# Patient Record
Sex: Female | Born: 1935 | Race: White | Hispanic: No | Smoking: Never smoker
Health system: Southern US, Community
[De-identification: ages and names within clinical notes are randomized; demographics above are authoritative.]

## PROBLEM LIST (undated history)

## (undated) DIAGNOSIS — E1140 Type 2 diabetes mellitus with diabetic neuropathy, unspecified: Secondary | ICD-10-CM

## (undated) DIAGNOSIS — I4891 Unspecified atrial fibrillation: Secondary | ICD-10-CM

## (undated) DIAGNOSIS — K219 Gastro-esophageal reflux disease without esophagitis: Secondary | ICD-10-CM

## (undated) DIAGNOSIS — I428 Other cardiomyopathies: Secondary | ICD-10-CM

## (undated) HISTORY — DX: Type 2 diabetes mellitus with diabetic neuropathy, unspecified: E11.40

## (undated) HISTORY — PX: TONSILLECTOMY: SUR1361

## (undated) HISTORY — DX: Unspecified atrial fibrillation: I48.91

## (undated) HISTORY — DX: Gastro-esophageal reflux disease without esophagitis: K21.9

## (undated) HISTORY — DX: Other cardiomyopathies: I42.8

## (undated) HISTORY — PX: CATARACT EXTRACTION: SUR2

## (undated) HISTORY — PX: ABDOMINAL HYSTERECTOMY: SHX81

---

## 2003-11-15 ENCOUNTER — Ambulatory Visit: Payer: Self-pay | Admitting: Urology

## 2004-01-01 ENCOUNTER — Ambulatory Visit: Payer: Self-pay | Admitting: Internal Medicine

## 2004-06-11 ENCOUNTER — Ambulatory Visit: Payer: Self-pay | Admitting: Nurse Practitioner

## 2004-07-02 ENCOUNTER — Ambulatory Visit: Payer: Self-pay | Admitting: Cardiology

## 2004-07-15 ENCOUNTER — Ambulatory Visit: Payer: Self-pay | Admitting: Urology

## 2004-07-17 ENCOUNTER — Ambulatory Visit: Payer: Self-pay | Admitting: Internal Medicine

## 2004-08-10 ENCOUNTER — Ambulatory Visit: Payer: Self-pay | Admitting: Internal Medicine

## 2004-09-10 ENCOUNTER — Ambulatory Visit: Payer: Self-pay

## 2004-09-10 ENCOUNTER — Ambulatory Visit: Payer: Self-pay | Admitting: Internal Medicine

## 2004-10-22 ENCOUNTER — Ambulatory Visit: Payer: Self-pay | Admitting: Internal Medicine

## 2005-06-23 ENCOUNTER — Ambulatory Visit: Payer: Self-pay | Admitting: Internal Medicine

## 2006-01-05 ENCOUNTER — Ambulatory Visit: Payer: Self-pay | Admitting: Internal Medicine

## 2007-01-25 ENCOUNTER — Ambulatory Visit: Payer: Self-pay | Admitting: Internal Medicine

## 2007-03-23 ENCOUNTER — Ambulatory Visit: Payer: Self-pay | Admitting: Gastroenterology

## 2007-03-23 ENCOUNTER — Other Ambulatory Visit: Payer: Self-pay

## 2007-08-05 ENCOUNTER — Ambulatory Visit: Payer: Self-pay | Admitting: Gastroenterology

## 2007-08-05 ENCOUNTER — Other Ambulatory Visit: Payer: Self-pay

## 2007-08-09 ENCOUNTER — Ambulatory Visit: Payer: Self-pay | Admitting: Gastroenterology

## 2007-09-11 ENCOUNTER — Ambulatory Visit: Payer: Self-pay | Admitting: Oncology

## 2007-09-22 ENCOUNTER — Ambulatory Visit: Payer: Self-pay | Admitting: Oncology

## 2007-10-12 ENCOUNTER — Ambulatory Visit: Payer: Self-pay | Admitting: Oncology

## 2007-11-11 ENCOUNTER — Ambulatory Visit: Payer: Self-pay | Admitting: Oncology

## 2008-02-01 ENCOUNTER — Ambulatory Visit: Payer: Self-pay | Admitting: Internal Medicine

## 2008-02-11 ENCOUNTER — Ambulatory Visit: Payer: Self-pay | Admitting: Oncology

## 2008-02-29 ENCOUNTER — Ambulatory Visit: Payer: Self-pay | Admitting: Oncology

## 2008-03-13 ENCOUNTER — Ambulatory Visit: Payer: Self-pay | Admitting: Oncology

## 2008-08-10 ENCOUNTER — Ambulatory Visit: Payer: Self-pay | Admitting: Oncology

## 2008-08-29 ENCOUNTER — Ambulatory Visit: Payer: Self-pay | Admitting: Oncology

## 2008-09-10 ENCOUNTER — Ambulatory Visit: Payer: Self-pay | Admitting: Oncology

## 2009-02-07 ENCOUNTER — Ambulatory Visit: Payer: Self-pay | Admitting: Internal Medicine

## 2009-03-13 ENCOUNTER — Ambulatory Visit: Payer: Self-pay | Admitting: Oncology

## 2009-03-20 ENCOUNTER — Ambulatory Visit: Payer: Self-pay | Admitting: Oncology

## 2009-04-10 ENCOUNTER — Ambulatory Visit: Payer: Self-pay | Admitting: Oncology

## 2009-08-03 ENCOUNTER — Emergency Department: Payer: Self-pay | Admitting: Internal Medicine

## 2009-10-25 ENCOUNTER — Ambulatory Visit: Payer: Self-pay | Admitting: Internal Medicine

## 2009-10-29 ENCOUNTER — Inpatient Hospital Stay: Payer: Self-pay | Admitting: Internal Medicine

## 2010-02-19 ENCOUNTER — Ambulatory Visit: Payer: Self-pay | Admitting: Internal Medicine

## 2010-03-05 ENCOUNTER — Inpatient Hospital Stay: Payer: Self-pay | Admitting: Internal Medicine

## 2010-09-18 ENCOUNTER — Ambulatory Visit: Payer: Self-pay | Admitting: Ophthalmology

## 2010-10-02 ENCOUNTER — Inpatient Hospital Stay: Payer: Self-pay | Admitting: Internal Medicine

## 2010-10-09 DIAGNOSIS — I509 Heart failure, unspecified: Secondary | ICD-10-CM

## 2010-10-09 DIAGNOSIS — I4891 Unspecified atrial fibrillation: Secondary | ICD-10-CM

## 2010-10-09 DIAGNOSIS — J9610 Chronic respiratory failure, unspecified whether with hypoxia or hypercapnia: Secondary | ICD-10-CM

## 2010-10-09 DIAGNOSIS — R7881 Bacteremia: Secondary | ICD-10-CM

## 2010-10-09 DIAGNOSIS — A419 Sepsis, unspecified organism: Secondary | ICD-10-CM

## 2010-10-09 DIAGNOSIS — E1165 Type 2 diabetes mellitus with hyperglycemia: Secondary | ICD-10-CM

## 2010-10-09 DIAGNOSIS — D649 Anemia, unspecified: Secondary | ICD-10-CM

## 2010-10-15 ENCOUNTER — Telehealth: Payer: Self-pay | Admitting: *Deleted

## 2010-10-15 NOTE — Telephone Encounter (Signed)
Will review at [facility] today

## 2010-10-15 NOTE — Telephone Encounter (Signed)
[facility] Triage Call Report Triage Record Num: [id number] Operator: Lodema Pilot Patient Name: Alicia Henry Call Date & Time: 10/12/2010 10:34:55AM Patient Phone: [phone] PCP: Patient Gender: Female PCP Fax : Patient DOB: 1924/09/05 Practice Name: [place] [facility] Reason for Call: PCP is Ruthe Mannan [facility]). Callback number is [phone]. Becky,Nurse, from [facility] calling regarding Alicia Henry. Pt has itchy rash around "panty line" from wearing Depends Undergarment. Per Rash Protocol, standing order of Benadryl 25 mg PO q 4 hours PRN itchy. Max 6 doses in 24 hours. Call PCP if not better in 48 hours. Protocol(s) Used: Hives Protocol(s) Used: Rash Recommended Outcome per Protocol: See Provider within 4 hours Reason for Outcome: First occurrence of mild allergic reaction (rash; hives; itching; nasal congestion; watery, red eyes) that occurs within minutes to several hours after exposure to an allergen AND without any other signs or symptoms of anaphylaxis. Localized or widespread rash Care Advice: ~ Call provider if symptoms worsen or new symptoms develop. ~ SYMPTOM / CONDITION MANAGEMENT 10/12/2010 10:58:35AM Page 1 of 1 [email or web address]_[email or web address]_[email or web address]

## 2010-10-31 DIAGNOSIS — L03119 Cellulitis of unspecified part of limb: Secondary | ICD-10-CM

## 2010-10-31 DIAGNOSIS — L02419 Cutaneous abscess of limb, unspecified: Secondary | ICD-10-CM

## 2010-11-06 DIAGNOSIS — L0291 Cutaneous abscess, unspecified: Secondary | ICD-10-CM

## 2010-11-06 DIAGNOSIS — L0390 Cellulitis, unspecified: Secondary | ICD-10-CM

## 2010-11-27 DIAGNOSIS — S91009A Unspecified open wound, unspecified ankle, initial encounter: Secondary | ICD-10-CM

## 2010-11-27 DIAGNOSIS — I509 Heart failure, unspecified: Secondary | ICD-10-CM

## 2010-11-27 DIAGNOSIS — R269 Unspecified abnormalities of gait and mobility: Secondary | ICD-10-CM

## 2010-11-27 DIAGNOSIS — E119 Type 2 diabetes mellitus without complications: Secondary | ICD-10-CM

## 2010-11-27 DIAGNOSIS — S81809A Unspecified open wound, unspecified lower leg, initial encounter: Secondary | ICD-10-CM

## 2010-11-27 DIAGNOSIS — S81009A Unspecified open wound, unspecified knee, initial encounter: Secondary | ICD-10-CM

## 2010-12-04 ENCOUNTER — Ambulatory Visit: Payer: Self-pay | Admitting: Ophthalmology

## 2011-05-06 ENCOUNTER — Other Ambulatory Visit: Payer: Medicare Other

## 2011-06-04 ENCOUNTER — Other Ambulatory Visit: Payer: Medicare Other

## 2011-06-09 ENCOUNTER — Inpatient Hospital Stay: Payer: Self-pay | Admitting: Internal Medicine

## 2011-06-09 LAB — PRO B NATRIURETIC PEPTIDE: B-Type Natriuretic Peptide: 2978 pg/mL — ABNORMAL HIGH (ref 0–450)

## 2011-06-09 LAB — COMPREHENSIVE METABOLIC PANEL
BUN: 47 mg/dL — ABNORMAL HIGH (ref 7–18)
Co2: 30 mmol/L (ref 21–32)
Creatinine: 1.54 mg/dL — ABNORMAL HIGH (ref 0.60–1.30)
Osmolality: 296 (ref 275–301)
Potassium: 4.7 mmol/L (ref 3.5–5.1)
SGPT (ALT): 14 U/L
Total Protein: 7.6 g/dL (ref 6.4–8.2)

## 2011-06-09 LAB — CBC
HGB: 11.3 g/dL — ABNORMAL LOW (ref 12.0–16.0)
MCHC: 32.7 g/dL (ref 32.0–36.0)
MCV: 97 fL (ref 80–100)
RBC: 3.58 10*6/uL — ABNORMAL LOW (ref 3.80–5.20)
RDW: 16.2 % — ABNORMAL HIGH (ref 11.5–14.5)

## 2011-06-09 LAB — URINALYSIS, COMPLETE
Nitrite: NEGATIVE
RBC,UR: 5 /HPF (ref 0–5)
WBC UR: 6 /HPF (ref 0–5)

## 2011-06-09 LAB — CK TOTAL AND CKMB (NOT AT ARMC): CK-MB: 1.5 ng/mL (ref 0.5–3.6)

## 2011-06-09 LAB — PROTIME-INR: INR: 1.7

## 2011-06-09 LAB — TROPONIN I
Troponin-I: <0.02 ng/mL
Troponin-I: 0.02 ng/mL
Troponin-I: 0.02 ng/mL

## 2011-06-09 LAB — LIPID PANEL
HDL Cholesterol: 26 mg/dL — ABNORMAL LOW (ref 40–60)
Ldl Cholesterol, Calc: 36 mg/dL (ref 0–100)
VLDL Cholesterol, Calc: 9 mg/dL (ref 5–40)

## 2011-06-10 LAB — LIPID PANEL
Cholesterol: 61 mg/dL (ref 0–200)
HDL Cholesterol: 22 mg/dL — ABNORMAL LOW (ref 40–60)
Triglycerides: 37 mg/dL (ref 0–200)
VLDL Cholesterol, Calc: 7 mg/dL (ref 5–40)

## 2011-06-10 LAB — CBC WITH DIFFERENTIAL/PLATELET
Basophil %: 0.8 %
Eosinophil %: 2.4 %
HCT: 32.6 % — ABNORMAL LOW (ref 35.0–47.0)
MCH: 31.4 pg (ref 26.0–34.0)
MCHC: 32.9 g/dL (ref 32.0–36.0)
Monocyte #: 0.6 x10 3/mm (ref 0.2–0.9)
Neutrophil #: 3.9 10*3/uL (ref 1.4–6.5)
RDW: 15.9 % — ABNORMAL HIGH (ref 11.5–14.5)
WBC: 5.9 10*3/uL (ref 3.6–11.0)

## 2011-06-10 LAB — PROTIME-INR
INR: 2.1
Prothrombin Time: 24 secs — ABNORMAL HIGH (ref 11.5–14.7)

## 2011-06-10 LAB — BASIC METABOLIC PANEL
Anion Gap: 8 (ref 7–16)
BUN: 46 mg/dL — ABNORMAL HIGH (ref 7–18)
EGFR (African American): 37 — ABNORMAL LOW
EGFR (Non-African Amer.): 32 — ABNORMAL LOW
Osmolality: 294 (ref 275–301)
Sodium: 140 mmol/L (ref 136–145)

## 2011-06-10 LAB — URINE CULTURE

## 2011-06-10 LAB — MAGNESIUM: Magnesium: 2.6 mg/dL — ABNORMAL HIGH

## 2011-06-11 LAB — URINALYSIS, COMPLETE
Bacteria: NONE SEEN
Blood: NEGATIVE
Ketone: NEGATIVE
Ph: 7 (ref 4.5–8.0)
Protein: 30
Specific Gravity: 1.01 (ref 1.003–1.030)
WBC UR: 1 /HPF (ref 0–5)

## 2011-06-11 LAB — CBC WITH DIFFERENTIAL/PLATELET
Basophil #: 0.1 10*3/uL (ref 0.0–0.1)
Basophil %: 0.8 %
Eosinophil #: 0.3 10*3/uL (ref 0.0–0.7)
HCT: 33 % — ABNORMAL LOW (ref 35.0–47.0)
MCV: 96 fL (ref 80–100)
Monocyte %: 12.4 %
Neutrophil %: 63.3 %
RBC: 3.43 10*6/uL — ABNORMAL LOW (ref 3.80–5.20)
RDW: 16.1 % — ABNORMAL HIGH (ref 11.5–14.5)
WBC: 5.9 10*3/uL (ref 3.6–11.0)

## 2011-06-11 LAB — BASIC METABOLIC PANEL
Anion Gap: 7 (ref 7–16)
BUN: 40 mg/dL — ABNORMAL HIGH (ref 7–18)
Calcium, Total: 8.3 mg/dL — ABNORMAL LOW (ref 8.5–10.1)
Chloride: 103 mmol/L (ref 98–107)
Co2: 30 mmol/L (ref 21–32)
Creatinine: 1.35 mg/dL — ABNORMAL HIGH (ref 0.60–1.30)
EGFR (African American): 42 — ABNORMAL LOW
Glucose: 92 mg/dL (ref 65–99)
Sodium: 140 mmol/L (ref 136–145)

## 2011-06-11 LAB — PROTIME-INR: Prothrombin Time: 26.7 secs — ABNORMAL HIGH (ref 11.5–14.7)

## 2011-06-12 LAB — HEPATIC FUNCTION PANEL A (ARMC)
Bilirubin, Direct: 0.2 mg/dL (ref 0.00–0.20)
Bilirubin,Total: 0.4 mg/dL (ref 0.2–1.0)
SGOT(AST): 15 U/L (ref 15–37)
SGPT (ALT): 13 U/L

## 2011-06-12 LAB — BASIC METABOLIC PANEL
Anion Gap: 4 — ABNORMAL LOW (ref 7–16)
Anion Gap: 3 — ABNORMAL LOW (ref 7–16)
BUN: 43 mg/dL — ABNORMAL HIGH (ref 7–18)
BUN: 40 mg/dL — ABNORMAL HIGH (ref 7–18)
Calcium, Total: 8.3 mg/dL — ABNORMAL LOW (ref 8.5–10.1)
Chloride: 103 mmol/L (ref 98–107)
Co2: 31 mmol/L (ref 21–32)
Co2: 32 mmol/L (ref 21–32)
Creatinine: 1.25 mg/dL (ref 0.60–1.30)
EGFR (African American): 42 — ABNORMAL LOW
EGFR (African American): 46 — ABNORMAL LOW
EGFR (Non-African Amer.): 36 — ABNORMAL LOW
EGFR (Non-African Amer.): 39 — ABNORMAL LOW
Glucose: 170 mg/dL — ABNORMAL HIGH (ref 65–99)
Osmolality: 286 (ref 275–301)
Osmolality: 289 (ref 275–301)
Potassium: 5.6 mmol/L — ABNORMAL HIGH (ref 3.5–5.1)
Potassium: 5.4 mmol/L — ABNORMAL HIGH (ref 3.5–5.1)
Sodium: 136 mmol/L (ref 136–145)

## 2011-06-12 LAB — POTASSIUM: Potassium: 6.5 mmol/L (ref 3.5–5.1)

## 2011-06-12 LAB — PROTIME-INR: Prothrombin Time: 27.6 secs — ABNORMAL HIGH (ref 11.5–14.7)

## 2011-06-12 LAB — CK: CK, Total: 50 U/L (ref 21–215)

## 2011-06-12 LAB — PHOSPHORUS: Phosphorus: 3.3 mg/dL (ref 2.5–4.9)

## 2011-06-13 LAB — PROTEIN / CREATININE RATIO, URINE
Creatinine, Urine: 56.6 mg/dL (ref 30.0–125.0)
Protein, Random Urine: 26 mg/dL — ABNORMAL HIGH (ref 0–12)

## 2011-06-13 LAB — URINALYSIS, COMPLETE
Bacteria: NONE SEEN
Bilirubin,UR: NEGATIVE
Blood: NEGATIVE
Glucose,UR: NEGATIVE mg/dL (ref 0–75)
Ketone: NEGATIVE
Leukocyte Esterase: NEGATIVE
Ph: 7 (ref 4.5–8.0)
RBC,UR: 5 /HPF (ref 0–5)
Specific Gravity: 1.013 (ref 1.003–1.030)
Squamous Epithelial: 1
WBC UR: <1 /HPF (ref 0–5)

## 2011-06-13 LAB — BASIC METABOLIC PANEL
Anion Gap: 6 — ABNORMAL LOW (ref 7–16)
Calcium, Total: 8 mg/dL — ABNORMAL LOW (ref 8.5–10.1)
Creatinine: 1.19 mg/dL (ref 0.60–1.30)
EGFR (African American): 49 — ABNORMAL LOW
Glucose: 143 mg/dL — ABNORMAL HIGH (ref 65–99)
Potassium: 4.9 mmol/L (ref 3.5–5.1)
Sodium: 142 mmol/L (ref 136–145)

## 2011-06-13 LAB — URINE CULTURE

## 2011-06-13 LAB — PROTIME-INR: INR: 3.2

## 2011-06-16 ENCOUNTER — Telehealth: Payer: Self-pay | Admitting: Family Medicine

## 2011-06-16 NOTE — Telephone Encounter (Signed)
Reviewed today appt scheduled for tomorrow

## 2011-06-16 NOTE — Telephone Encounter (Signed)
Triage Record Num: [id number] Operator: Tomasita Crumble Patient Name: Alicia Henry Call Date & Time: 06/15/2011 8:08:08PM Patient Phone: [phone] PCP: Ruthe Mannan Patient Gender: Female PCP Fax : [phone] Patient DOB: 1925/01/25 Practice Name: [facility] Reason for Call: Caller: Effie Shy, LPN from [facility]; PCP: Ruthe Mannan [facility]); CB#: [phone]; Call regarding Needs to report labs; BMP done stat on 06/14/11. Ordered by Dr. Abner Greenspan. Results Na 137, K+ 4.3, [place] 102, CO 29, Glucose 153, BUN 29, Creat 1.14, Calcium 8.4. None are critical values. Patient has generalized edema and patient had Lasix ordered. Per caller, Labs were to indicate if she could tolerate increased Lasix dose. Dr. Abner Greenspan on calll she was given results. Order rec'd: Marland Kitchen May give Lasix 40mg  daily as need for edema or shortness of breath and folow up with PCP on 06/16/11. Caller informed. Protocol(s) Used: PCP Calls, No Triage (Adult) Recommended Outcome per Protocol: Call Provider within 24 Hours Reason for Outcome: Lab calling with test results Care Advice: ~ 06/15/2011 8:22:48PM Page 1 of 1 [email or web address]_[email or web address]_[email or web address]

## 2011-06-16 NOTE — Telephone Encounter (Signed)
Will send to Dr. Alphonsus Sias since he will be seeing her.

## 2011-06-17 DIAGNOSIS — E1149 Type 2 diabetes mellitus with other diabetic neurological complication: Secondary | ICD-10-CM

## 2011-06-17 DIAGNOSIS — I43 Cardiomyopathy in diseases classified elsewhere: Secondary | ICD-10-CM

## 2011-06-17 DIAGNOSIS — I4891 Unspecified atrial fibrillation: Secondary | ICD-10-CM

## 2011-06-17 DIAGNOSIS — R55 Syncope and collapse: Secondary | ICD-10-CM

## 2011-06-17 LAB — UR PROT ELECTROPHORESIS, URINE RANDOM

## 2011-06-19 DIAGNOSIS — S5010XA Contusion of unspecified forearm, initial encounter: Secondary | ICD-10-CM

## 2011-07-21 DIAGNOSIS — K219 Gastro-esophageal reflux disease without esophagitis: Secondary | ICD-10-CM

## 2011-07-21 DIAGNOSIS — E1149 Type 2 diabetes mellitus with other diabetic neurological complication: Secondary | ICD-10-CM

## 2011-07-21 DIAGNOSIS — I4891 Unspecified atrial fibrillation: Secondary | ICD-10-CM

## 2011-07-21 DIAGNOSIS — I43 Cardiomyopathy in diseases classified elsewhere: Secondary | ICD-10-CM

## 2011-07-28 ENCOUNTER — Telehealth: Payer: Self-pay | Admitting: *Deleted

## 2011-07-28 NOTE — Telephone Encounter (Signed)
Denise with Hospice notified as instructed by telephone.

## 2011-07-28 NOTE — Telephone Encounter (Signed)
Ok to have home hospice care.

## 2011-07-28 NOTE — Telephone Encounter (Signed)
Patient is being discharged from [facility] tomorrow. Needs verbal consent for at home hospice care. Please advise.

## 2011-08-11 DIAGNOSIS — I4891 Unspecified atrial fibrillation: Secondary | ICD-10-CM

## 2011-08-11 DIAGNOSIS — I509 Heart failure, unspecified: Secondary | ICD-10-CM

## 2011-08-11 DIAGNOSIS — J449 Chronic obstructive pulmonary disease, unspecified: Secondary | ICD-10-CM

## 2011-08-12 LAB — PROTIME-INR: INR: 3.6 — AB (ref 0.9–1.1)

## 2011-08-13 ENCOUNTER — Telehealth: Payer: Self-pay | Admitting: *Deleted

## 2011-08-13 MED ORDER — LISINOPRIL 2.5 MG PO TABS: 2.5000 mg | ORAL_TABLET | Freq: Every day | ORAL | Status: DC

## 2011-08-13 NOTE — Telephone Encounter (Signed)
Alicia Batten, do you have pharmacy name and how pt takes this medicine?  Is it to go to [facility]?, Does she take one a day?

## 2011-08-13 NOTE — Telephone Encounter (Signed)
Ok to refill but she needs to either establish care here or go see her previous PCP. We were following her at [facility] but she has been discharged home on hospice.

## 2011-08-13 NOTE — Telephone Encounter (Signed)
Lisinopril 2.5 mg 1 PO QD. [facility] [address]. Sorry!

## 2011-08-13 NOTE — Telephone Encounter (Signed)
Received refill request for Lisinopril 2.5 mg. No meds on med list. Looks like a home care/nursing home patient. Please advise.

## 2011-08-18 ENCOUNTER — Telehealth: Payer: Self-pay | Admitting: *Deleted

## 2011-08-18 NOTE — Telephone Encounter (Signed)
Thank you :)

## 2011-08-18 NOTE — Telephone Encounter (Signed)
Left message asking hospice nurse to call me back.

## 2011-08-18 NOTE — Telephone Encounter (Signed)
Please change dose to 2 mg Tuesday, Wednesday, Thursday and Sunday. 3 mg Monday, Friday, Saturday. Recheck PT/INR in 1 week. Unfortunately, she will eventually need to be seen here for me to continue signing her paper work and adjusting her coumadin since she has not established with this practice.

## 2011-08-18 NOTE — Telephone Encounter (Signed)
Hospice nurse is asking that you regulate this pt's coumadin dose. Pt has not been seen in this office as a patient, daughter states she is too ill to come in.  Pt takes 3 mg's on Monday, Wednesday, Friday and Saturday and 2 mg's on Tuesday, Thursday and Sunday.

## 2011-08-18 NOTE — Telephone Encounter (Signed)
Spoke with hospice triage nurse, Jasmine December.  She says Dr. Darrold Junker, one of the doctor's that Hospice consults with, has agreed to monitor pt's coumadin.  I again advised nurse that pt will need to be seen to get established as a patient.  She agreed.

## 2011-08-19 ENCOUNTER — Telehealth: Payer: Self-pay | Admitting: Internal Medicine

## 2011-08-19 NOTE — Telephone Encounter (Signed)
Talia,  I am overwhemed with home visits now. If you can't get out to see her, we will have to say no  Luan Pulling

## 2011-08-19 NOTE — Telephone Encounter (Signed)
I do that frequently Technically, she is not a new patient since she was seen at [facility] Normally I would get a copy of H&P and MAR from [facility] and abstract chart Billing info in chart already  If truly new patient, just need billing and demographics and Lyla Son creates a chart and then you just do the visit  Remember, don't bill new home visit since we have billed visits with her already

## 2011-08-19 NOTE — Telephone Encounter (Signed)
Rich, I have never done a new patient home visit (meaning that they are not established here yet).  That is why I asked her to come to the office first.  If you think it is ok to do a home visit on someone not established with our office, I can see her next week.

## 2011-08-19 NOTE — Telephone Encounter (Signed)
Ok I am very sorry I misunderstood.  Lyla Son, I can see her Wednesday (1 week from tomorrow) afternoon.

## 2011-08-19 NOTE — Telephone Encounter (Signed)
Patient was in Rehab at [facility] and she was released 2-3 weeks ago.  Dr.Aron saw her at [facility].  Her PCP was Dr. Nicanor Bake he is retiring.  Patient has been with Hospice for a year.  Patient can get around the house with her walker,but she's afraid to go out.  Dr. Dayton Martes offered to see her,if she could come in to the office,but she doesn't want to come to the office.  [name] wanted to know if you would do home visits.

## 2011-08-21 ENCOUNTER — Encounter: Payer: Self-pay | Admitting: Family Medicine

## 2011-08-26 ENCOUNTER — Telehealth: Payer: Self-pay | Admitting: Family Medicine

## 2011-08-26 ENCOUNTER — Telehealth: Payer: Self-pay | Admitting: *Deleted

## 2011-08-26 NOTE — Telephone Encounter (Signed)
Message copied by Eliezer Bottom on Tue Aug 26, 2011 11:43 AM ------      Message from: Dianne Dun      Created: Tue Aug 26, 2011  7:25 AM       Please call pt to let her know that I am planning to come see her tomorrow afternoon for a home visit (around 2 or 2:30 pm).      Thanks,      [employer]

## 2011-08-26 NOTE — Telephone Encounter (Signed)
Error. Disregard

## 2011-08-26 NOTE — Telephone Encounter (Deleted)
Alicia Henry sent me an email requested scheduled daily stool softener or laxative.  She has prn dulcolax but forgets to ask for it.  [facility]ul[facility].

## 2011-08-26 NOTE — Telephone Encounter (Signed)
Advised pt of upcoming visit.  I asked her to let her daughter know as well.  Pt's home phone # is [phone], daughter Patricia's is [phone].

## 2011-08-27 ENCOUNTER — Encounter: Payer: Self-pay | Admitting: Family Medicine

## 2011-08-27 ENCOUNTER — Ambulatory Visit: Payer: Medicare Other | Admitting: Family Medicine

## 2011-08-27 VITALS — BP 136/62 | HR 72 | Resp 22

## 2011-08-27 DIAGNOSIS — E1140 Type 2 diabetes mellitus with diabetic neuropathy, unspecified: Secondary | ICD-10-CM

## 2011-08-27 DIAGNOSIS — Z789 Other specified health status: Secondary | ICD-10-CM | POA: Insufficient documentation

## 2011-08-27 DIAGNOSIS — E1142 Type 2 diabetes mellitus with diabetic polyneuropathy: Secondary | ICD-10-CM

## 2011-08-27 DIAGNOSIS — I428 Other cardiomyopathies: Secondary | ICD-10-CM

## 2011-08-27 DIAGNOSIS — Z66 Do not resuscitate: Secondary | ICD-10-CM

## 2011-08-27 DIAGNOSIS — E1149 Type 2 diabetes mellitus with other diabetic neurological complication: Secondary | ICD-10-CM

## 2011-08-27 DIAGNOSIS — R609 Edema, unspecified: Secondary | ICD-10-CM | POA: Insufficient documentation

## 2011-08-27 DIAGNOSIS — I4891 Unspecified atrial fibrillation: Secondary | ICD-10-CM | POA: Insufficient documentation

## 2011-08-27 DIAGNOSIS — K219 Gastro-esophageal reflux disease without esophagitis: Secondary | ICD-10-CM

## 2011-08-27 NOTE — Progress Notes (Signed)
Subjective:    Patient ID: Alicia Henry, female    DOB: 06/28/1924, 78 y.o.   MRN: [id number]  HPI  78 yo female with h/o cardiomyopathy, afib, IDDM. She is now on hospice (remains full code) and requested a home visit.  I last saw her during her last stay at [facility] s/p fall in June.  I am seeing her with both of her daughters present.  Overall, she has been doing ok. She is pleased that when she fell in the bathroom last week, she had the strength to get up.  She feels PT at [facility] really helped her.  She has not been doing her home exercises, but she wants to restart them.  Appetite is good.  Spoke with hospice nurse, phoebe, who feels her LE edema is improving (still worse than it was when she left [facility]).  Mood is ok.  Does not like being on oxygen but otherwise spirits are good.  CBGs running between 120-200.  Denies any episodes of hypoglycemia.  Did have one reading weeks ago near 400. On Lantus and SSI.  Patient Active Problem List  Diagnosis  . Valvular cardiomyopathy  . Atrial fibrillation  . Diabetes mellitus with neuropathy  . GERD (gastroesophageal reflux disease)   Past Medical History  Diagnosis Date  . Valvular cardiomyopathy   . Atrial fibrillation   . Diabetes mellitus with neuropathy   . GERD (gastroesophageal reflux disease)    Past Surgical History  Procedure Date  . Abdominal hysterectomy   . Tonsillectomy   . Cataract extraction    History  Substance Use Topics  . Smoking status: Not on file  . Smokeless tobacco: Not on file  . Alcohol Use:    No family history on file. Allergies not on file Current Outpatient Prescriptions on File Prior to Visit  Medication Sig Dispense Refill  . budesonide-formoterol (SYMBICORT) 160-4.5 MCG/ACT inhaler Inhale 2 puffs into the lungs 2 (two) times daily.      . insulin aspart (NOVOLOG) 100 UNIT/ML injection Inject into the skin 3 (three) times daily before meals. SSI      . insulin glargine  (LANTUS) 100 UNIT/ML injection Inject 30 Units into the skin at bedtime.      Marland Kitchen levothyroxine (SYNTHROID, LEVOTHROID) 25 MCG tablet Take 25 mcg by mouth daily.      Marland Kitchen loratadine (CLARITIN) 10 MG tablet Take 10 mg by mouth daily.      . metoprolol tartrate (LOPRESSOR) 25 MG tablet Take 25 mg by mouth 2 (two) times daily.      Marland Kitchen omeprazole (PRILOSEC) 20 MG capsule Take 20 mg by mouth daily.      . simvastatin (ZOCOR) 20 MG tablet Take 20 mg by mouth every evening.      . warfarin (COUMADIN) 3 MG tablet Take 3 mg by mouth daily.      Marland Kitchen lisinopril (ZESTRIL) 2.5 MG tablet Take 1 tablet (2.5 mg total) by mouth daily.  30 tablet  0   The PMH, PSH, Social History, Family History, Medications, and allergies have been reviewed in [facility], and have been updated if relevant.   Review of Systems See HPI No CP No worsening SOB     Objective:   Physical Exam BP 136/62  Pulse 72  Resp 22 Gen:  Alert, pleasant female in NAD HEENT:  MMM, o2 per [place] Resp:  CTA bilaterally CVS:  Irregularly irregular Abd:  Soft, NT, pos bs Ext:  1+ edema bilaterally, no weeping  Assessment & Plan:   1. GERD (gastroesophageal reflux disease)  Stable on Prilosec.  2. Diabetes mellitus with neuropathy  CBGs stable, no changes.  3. Atrial fibrillation  Rate controlled. On metoprolol, coumadin.  4. Valvular cardiomyopathy  Gradual decline.  On 02.  5. Dependent edema  Deteriorated since d/c Advised working on exercises, keeping legs elevated. The patient indicates understanding of these issues and agrees with the plan.

## 2011-08-29 ENCOUNTER — Encounter: Payer: Self-pay | Admitting: Family Medicine

## 2011-09-02 LAB — PROTIME-INR

## 2011-09-04 ENCOUNTER — Telehealth: Payer: Self-pay | Admitting: *Deleted

## 2011-09-04 NOTE — Telephone Encounter (Signed)
Noted! Thank you

## 2011-09-04 NOTE — Telephone Encounter (Signed)
Spoke with patient's hospice nurse, Alicia Henry.  Verified that pt is taking 2 mg's of coumadin on tues, thurs, Sunday and 3 mg's on mon, wed and Saturday.  Per Dr. Dayton Martes, advised her to increase to 2 mg's on tues and Thursday, 3 mg's on mon, wed, fri, sat and Sunday.  Recheck PT/ INR in one week.  Alicia Henry agreed.

## 2011-09-17 ENCOUNTER — Encounter: Payer: Self-pay | Admitting: Family Medicine

## 2011-10-01 ENCOUNTER — Encounter: Payer: Self-pay | Admitting: Family Medicine

## 2011-10-08 ENCOUNTER — Encounter: Payer: Self-pay | Admitting: Family Medicine

## 2011-10-09 ENCOUNTER — Telehealth: Payer: Self-pay

## 2011-10-09 NOTE — Telephone Encounter (Signed)
Alicia Henry with [facility] ; Alicia Henry has spoken with pt and will redraw in 2 weeks. No call back needed.

## 2011-10-29 ENCOUNTER — Encounter: Payer: Self-pay | Admitting: Family Medicine

## 2011-11-04 ENCOUNTER — Telehealth: Payer: Self-pay

## 2011-11-04 MED ORDER — VALACYCLOVIR HCL 1 G PO TABS: 1000.0000 mg | ORAL_TABLET | Freq: Three times a day (TID) | ORAL | Status: AC

## 2011-11-04 NOTE — Telephone Encounter (Signed)
Alicia Henry with [facility] is at pts home; blistery rash next to rt eye and goes up forehead into hair line. Just noticed this AM. Does Dr Dayton Martes need to see pt or call med in to [facility].[name] request call back.

## 2011-11-04 NOTE — Telephone Encounter (Signed)
Phineas Inches, hospice nurse.

## 2011-11-04 NOTE — Telephone Encounter (Signed)
Valtrex sent to her pharmacy but if symptoms worsen or there is ANY eye involvement, needs to be evaluated immediately.

## 2011-11-26 ENCOUNTER — Encounter: Payer: Self-pay | Admitting: Family Medicine

## 2011-12-01 ENCOUNTER — Ambulatory Visit (INDEPENDENT_AMBULATORY_CARE_PROVIDER_SITE_OTHER): Payer: Medicare Other | Admitting: Family Medicine

## 2011-12-01 ENCOUNTER — Telehealth: Payer: Self-pay

## 2011-12-01 ENCOUNTER — Encounter: Payer: Self-pay | Admitting: Family Medicine

## 2011-12-01 VITALS — BP 132/68 | Temp 98.1°F | Wt 177.0 lb

## 2011-12-01 DIAGNOSIS — Z5181 Encounter for therapeutic drug level monitoring: Secondary | ICD-10-CM

## 2011-12-01 DIAGNOSIS — I4891 Unspecified atrial fibrillation: Secondary | ICD-10-CM

## 2011-12-01 DIAGNOSIS — Z23 Encounter for immunization: Secondary | ICD-10-CM

## 2011-12-01 DIAGNOSIS — S01502A Unspecified open wound of oral cavity, initial encounter: Secondary | ICD-10-CM | POA: Insufficient documentation

## 2011-12-01 LAB — CBC WITH DIFFERENTIAL/PLATELET
Eosinophils Relative: 3 % (ref 0.0–5.0)
HCT: 34 % — ABNORMAL LOW (ref 36.0–46.0)
Lymphs Abs: 1.2 10*3/uL (ref 0.7–4.0)
Monocytes Relative: 9.9 % (ref 3.0–12.0)
Platelets: 199 10*3/uL (ref 150.0–400.0)
WBC: 5.6 10*3/uL (ref 4.5–10.5)

## 2011-12-01 LAB — PROTIME-INR: INR: 2.9 ratio — ABNORMAL HIGH (ref 0.8–1.0)

## 2011-12-01 MED ORDER — FEXOFENADINE HCL 180 MG PO TABS: 180.0000 mg | ORAL_TABLET | Freq: Every day | ORAL | Status: AC

## 2011-12-01 NOTE — Telephone Encounter (Signed)
Advised patient's daughter, Darl Pikes.  Per Dr. Dayton Martes, advised her to hold coumadin tonight and call back tomorrow to report whether there was any additional bleeding.

## 2011-12-01 NOTE — Patient Instructions (Addendum)
Good to see you. After you go the lab, stop by to see Shirlee Limerick on your way out.

## 2011-12-01 NOTE — Addendum Note (Signed)
Addended by: Dianne Dun on: 12/01/2011 09:38 AM   Modules accepted: Orders

## 2011-12-01 NOTE — Telephone Encounter (Signed)
I would continue to hold the coumadin if she has had any additional bleeding.

## 2011-12-01 NOTE — Telephone Encounter (Signed)
pts daughter left v/m should pt take coumadin this evening; pt did not take coumadin 11/30/11.Please advise.

## 2011-12-01 NOTE — Progress Notes (Signed)
Subjective:    Patient ID: Alicia Henry, female    DOB: [birth year], 78 y.o.   MRN: [id number]  HPI  78 yo female with h/o cardiomyopathy, afib, IDDM. She is now on hospice (remains full code)here with her daughter for tongue bleeding.  Pt's daughter, Darl Pikes, called me over the weekend stating that her tongue was bleeding-pulling out large clots and it was difficult to swallow. No known trauma.  I advised she needed to take her straight to ER but daughter declined because bleeding had stopped.    She is on coumadin but INR was subtherapeutic at 1.7 on 10/15.  Coumadin was held by hospice nurse because she had a nose bleed.  Happened again last night after she ate dinner- bled for an hour.  This morning, ate grits without any bleeding issues.    Patient Active Problem List  Diagnosis  . Valvular cardiomyopathy  . Atrial fibrillation  . Diabetes mellitus with neuropathy  . GERD (gastroesophageal reflux disease)  . Dependent edema  . Wound, open, tongue or mouth floor   Past Medical History  Diagnosis Date  . Valvular cardiomyopathy   . Atrial fibrillation   . Diabetes mellitus with neuropathy   . GERD (gastroesophageal reflux disease)    Past Surgical History  Procedure Date  . Abdominal hysterectomy   . Tonsillectomy   . Cataract extraction    History  Substance Use Topics  . Smoking status: Not on file  . Smokeless tobacco: Not on file  . Alcohol Use:    No family history on file. Allergies not on file Current Outpatient Prescriptions on File Prior to Visit  Medication Sig Dispense Refill  . aspirin 81 MG chewable tablet Chew 81 mg by mouth daily.      . budesonide-formoterol (SYMBICORT) 160-4.5 MCG/ACT inhaler Inhale 2 puffs into the lungs 2 (two) times daily.      . insulin aspart (NOVOLOG) 100 UNIT/ML injection Inject into the skin 3 (three) times daily before meals. SSI      . insulin glargine (LANTUS) 100 UNIT/ML injection Inject 30 Units into the skin at  bedtime.      Marland Kitchen levothyroxine (SYNTHROID, LEVOTHROID) 25 MCG tablet Take 25 mcg by mouth daily.      Marland Kitchen lisinopril (ZESTRIL) 2.5 MG tablet Take 1 tablet (2.5 mg total) by mouth daily.  30 tablet  0  . loratadine (CLARITIN) 10 MG tablet Take 10 mg by mouth daily.      . metoprolol tartrate (LOPRESSOR) 25 MG tablet Take 25 mg by mouth 2 (two) times daily.      Marland Kitchen omeprazole (PRILOSEC) 20 MG capsule Take 20 mg by mouth daily.      . simvastatin (ZOCOR) 20 MG tablet Take 20 mg by mouth every evening.      . warfarin (COUMADIN) 2 MG tablet Take 2 mg's by mouth on Tuesday, Thursday, Sunday.      . warfarin (COUMADIN) 3 MG tablet Take 3 mg's by mouth on monday, wednesday, friday and Saturday.       The PMH, PSH, Social History, Family History, Medications, and allergies have been reviewed in [facility], and have been updated if relevant.   Review of Systems See HPI No CP No worsening SOB     Objective:   Physical Exam BP 132/68  Temp 98.1 F (36.7 C)  Wt 177 lb (80.287 kg) Gen:  Alert, pleasant female in NAD HEENT:  MMM,  Small circular, raised lesion/scab on center of tongue, otherwise unremarkable.  o2 per [place] Resp:  CTA bilaterally CVS:  Irregularly irregular Abd:  Soft, NT, pos bs Ext:  1+ edema bilaterally, no weeping       Assessment & Plan:    1. Wound, open, tongue or mouth floor  Not currently bleeding and she denies any trauma-  Will refer to ENT for immediate evaluation and treatment as it continues to occur with most meals. The patient indicates understanding of these issues and agrees with the plan.  Ambulatory referral to ENT, CBC with Differential  2. Atrial fibrillation    3. Encounter for therapeutic drug monitoring  Recheck PT and INR today. Protime-INR

## 2011-12-01 NOTE — Addendum Note (Signed)
Addended by: Eliezer Bottom on: 12/01/2011 05:07 PM   Modules accepted: Orders

## 2011-12-02 ENCOUNTER — Telehealth: Payer: Self-pay | Admitting: *Deleted

## 2011-12-02 NOTE — Telephone Encounter (Signed)
Pt seen yesterday for nose bleed and bleeding from tongue, pt did hold coumadin for 2 days, but they forgot to mention it yesterday. Pt is also having nose bleeds today, (it has stopped). Pt wants to know how she is to resume coumadin and when to have rechecked. Can call hospice nurse or pt

## 2011-12-02 NOTE — Telephone Encounter (Signed)
With recurrent nose bleeds, this will be difficult.  I would continue to hold it again today.  Resume tomorrow.  If nose bleeds continue, we may have to the have the discussion of stopping blood thinners but this will increase her risk of clot/strokes from a fib.

## 2011-12-02 NOTE — Telephone Encounter (Signed)
Left message asking nurse [name] to call back.

## 2011-12-02 NOTE — Telephone Encounter (Signed)
Caller: Insurance risk surveyor from  Hospice; Patient Name: Alicia Henry; PCP: Ruthe Mannan [facility]); Best Callback Phone Number: [phone] Patient was seen in office yesterday 12/01/2011 with nose bleed and bleeding tongue.  Has had multiple nose bleeds during the last week.  Held Coumadin last evening.  Had nose bleed that started when she got up this morning 10/22 and blood got over PJs  but was stopped by the time Stevie arrived at 10:15 am.  Has not had further bleeding.  Calling to get PT/INR report from yesterday, instructions from MD for further Coumadin.  Triaged in Nosebleed Guideline - Disposition:  See Provider Within 72 Hours due ot recurrent bleeding 3 or more episodes in the last week that stops with 10 minutes direct pressure.   PLEASE CONTACT STEVIE COLE/Hospice provider with PT/INR, Coumadin instructions.  (Says left prior note earlier this morning at another place in office)  PLEASE REACH STEVIE ON CELL  [phone].

## 2011-12-03 NOTE — Telephone Encounter (Signed)
I spoke with Alicia Henry and gave her the instructions given by Dr. Dayton Martes.

## 2011-12-04 ENCOUNTER — Telehealth: Payer: Self-pay | Admitting: *Deleted

## 2011-12-04 DIAGNOSIS — J449 Chronic obstructive pulmonary disease, unspecified: Secondary | ICD-10-CM

## 2011-12-04 DIAGNOSIS — I4891 Unspecified atrial fibrillation: Secondary | ICD-10-CM

## 2011-12-04 DIAGNOSIS — I509 Heart failure, unspecified: Secondary | ICD-10-CM

## 2011-12-04 NOTE — Telephone Encounter (Signed)
Advised [name].  She says she sees the patient on Tuesday.  OK to check then?

## 2011-12-04 NOTE — Telephone Encounter (Signed)
Advised [name].

## 2011-12-04 NOTE — Telephone Encounter (Signed)
Spoke with hospice nurse to be sure that she had spoken with pt's daughter regarding your instructions.  She said she did.  She's asking when do you want her to recheck pt's levels.  I apologized that no one had called her back yesterday.

## 2011-12-04 NOTE — Telephone Encounter (Signed)
Please recheck PT/INR on Monday.

## 2011-12-04 NOTE — Telephone Encounter (Signed)
Yes ok to check then

## 2011-12-09 ENCOUNTER — Other Ambulatory Visit: Payer: Self-pay | Admitting: *Deleted

## 2011-12-09 MED ORDER — LISINOPRIL 2.5 MG PO TABS: 2.5000 mg | ORAL_TABLET | Freq: Every day | ORAL | Status: DC

## 2011-12-16 ENCOUNTER — Telehealth: Payer: Self-pay

## 2011-12-16 NOTE — Telephone Encounter (Signed)
Alicia Henry with [facility] said pt saw Dr Rayvon Char dr last week and Dr Lucianne Muss suggested increasing thyroid med from 25 mcg to 50 mcg. Alicia Henry has requested Dr Lucianne Muss to fax Dr Dayton Martes the lab results; pts daugter,Alicia Henry wants Dr Dayton Martes to decide thyroid dosage. If any blood draw needed call Alicia Henry and she will draw blood for pt. Pt feels weak and tired and cannot come for appt. Call Alicia Henry with any orders for pt.

## 2011-12-17 MED ORDER — LEVOTHYROXINE SODIUM 50 MCG PO TABS: 50.0000 ug | ORAL_TABLET | Freq: Every day | ORAL | Status: AC

## 2011-12-17 NOTE — Telephone Encounter (Signed)
Labs reviewed.  I agree with Dr. Lucianne Muss- TSH is elevated which indicates low thyroid hormone and we should increase her synthroid to 50 mcg.  Recheck TSH, FT4 in 4 weeks. Dosage changed in Epic and rx sent to pharmacy.

## 2011-12-17 NOTE — Telephone Encounter (Signed)
Advised hospice nurse.  She needs order for labs- and also protime if due- faxed to hospice at [phone].

## 2011-12-18 NOTE — Telephone Encounter (Signed)
Order written.  In my box.

## 2011-12-18 NOTE — Telephone Encounter (Signed)
Order faxed to hospice.

## 2011-12-24 ENCOUNTER — Encounter: Payer: Self-pay | Admitting: Family Medicine

## 2011-12-30 ENCOUNTER — Telehealth: Payer: Self-pay | Admitting: Family Medicine

## 2011-12-30 NOTE — Telephone Encounter (Signed)
Caller: Febie/; Phone: [phone]; Reason for Call:Hospice  Nurse calling in regards to Labwork ordered 01/01/12 for Coumadin therapy.  Nurse has been informed that Daughter discontinued Coumadin on 12/27/11 due to a nosebleed.  Last dose received was 3 mg on 12/26/11.  Febie [facility] Nurse would like to know if Dr.  Dayton Martes would like to restart the Coumadin or continue to hold.  Also needs clarification on whether to draw lab on Thursday or discontinue.  Please follow up.

## 2011-12-30 NOTE — Telephone Encounter (Signed)
Advised [name].  She will talk to patient and her family and let us know what she decides to do.

## 2011-12-30 NOTE — Telephone Encounter (Signed)
I was unaware that she stopped it.  If they are aware of risk of increased stroke without coumadin, I'm ok with stopping it and continuing low dose aspirin.  No point in checking PT/INR if she has not been taking it.

## 2012-01-21 ENCOUNTER — Telehealth: Payer: Self-pay

## 2012-01-21 NOTE — Telephone Encounter (Signed)
Alicia Henry with [facility]; pt having increased weakness, dizziness, pt stopped Coumadin one month ago but had nose bleed all day Thanksgiving Day. Pts daughter request Dr Dayton Martes to do home visit. Alicia Henry said if Dr Dayton Martes wants labs prior to home visit, call Alicia Henry 3 days in advance and she will draw labs.Please advise.

## 2012-01-22 NOTE — Telephone Encounter (Signed)
Advised hospice nurse, she will draw cbc on 12/16.  Alicia Henry will put pt on schedule.

## 2012-01-22 NOTE — Telephone Encounter (Signed)
I can come see her Wednesday the 18th (afternoon).  Please add her to my schedule. Please check a CBC prior.

## 2012-01-27 ENCOUNTER — Encounter: Payer: Self-pay | Admitting: Family Medicine

## 2012-01-28 ENCOUNTER — Ambulatory Visit: Payer: Medicare Other | Admitting: Family Medicine

## 2012-01-28 VITALS — BP 144/60 | HR 82 | Resp 22 | Wt 166.0 lb

## 2012-01-28 DIAGNOSIS — R040 Epistaxis: Secondary | ICD-10-CM

## 2012-01-28 DIAGNOSIS — I4891 Unspecified atrial fibrillation: Secondary | ICD-10-CM

## 2012-01-28 DIAGNOSIS — E1140 Type 2 diabetes mellitus with diabetic neuropathy, unspecified: Secondary | ICD-10-CM

## 2012-01-28 DIAGNOSIS — E1149 Type 2 diabetes mellitus with other diabetic neurological complication: Secondary | ICD-10-CM

## 2012-01-28 DIAGNOSIS — E1142 Type 2 diabetes mellitus with diabetic polyneuropathy: Secondary | ICD-10-CM

## 2012-01-28 DIAGNOSIS — D500 Iron deficiency anemia secondary to blood loss (chronic): Secondary | ICD-10-CM | POA: Insufficient documentation

## 2012-01-28 DIAGNOSIS — K219 Gastro-esophageal reflux disease without esophagitis: Secondary | ICD-10-CM

## 2012-01-28 DIAGNOSIS — I428 Other cardiomyopathies: Secondary | ICD-10-CM

## 2012-01-28 MED ORDER — FERROUS SULFATE 300 (60 FE) MG/5ML PO SYRP: 300.0000 mg | ORAL_SOLUTION | Freq: Every day | ORAL | Status: AC

## 2012-01-28 NOTE — Progress Notes (Signed)
Subjective:    Patient ID: Alicia Henry, female    DOB: 26-Jan-1925, 78 y.o.   MRN: [id number]  HPI  78 yo female with h/o cardiomyopathy, afib, IDDM. She is now on hospice (remains full code) and requested a home visit.  Says she feels "awful."  Has no energy.  Stopped taking coumadin last month due to recurrent nose bleeds.  Still taking ASA daily. Nose bleeds typically bilateral and ENT felt there was no vessels to cauterize in her nose.  Had to have spot on her tongue cauterized due to bleeding.    Hemoglobin has dropped this month to 10.3 ( was 10.9 in October 2013).  MCV 96.  Appetite is fair and she has lost 10 pounds in 2 months.  "nothing tastes good." Wt Readings from Last 3 Encounters:  01/28/12 166 lb (75.297 kg)  12/01/11 177 lb (80.287 kg)   Overall mood is ok.  She enjoyed meeting her new great grandchild last week.   CBGs running between 120-200.  Denies any episodes of hypoglycemia.   On Lantus and SSI.  Patient Active Problem List  Diagnosis  . Valvular cardiomyopathy  . Atrial fibrillation  . Diabetes mellitus with neuropathy  . GERD (gastroesophageal reflux disease)  . Dependent edema  . Wound, open, tongue or mouth floor  . Epistaxis, recurrent  . Normocytic anemia due to blood loss   Past Medical History  Diagnosis Date  . Valvular cardiomyopathy   . Atrial fibrillation   . Diabetes mellitus with neuropathy   . GERD (gastroesophageal reflux disease)    Past Surgical History  Procedure Date  . Abdominal hysterectomy   . Tonsillectomy   . Cataract extraction    History  Substance Use Topics  . Smoking status: Never Smoker   . Smokeless tobacco: Not on file  . Alcohol Use: Not on file   No family history on file. Allergies  Allergen Reactions  . Sulfa Antibiotics     hospitalized  . Penicillins Rash   Current Outpatient Prescriptions on File Prior to Visit  Medication Sig Dispense Refill  . aspirin 81 MG chewable tablet Chew 81 mg by  mouth daily.      . budesonide-formoterol (SYMBICORT) 160-4.5 MCG/ACT inhaler Inhale 2 puffs into the lungs 2 (two) times daily.      . fexofenadine (ALLEGRA) 180 MG tablet Take 1 tablet (180 mg total) by mouth daily.  30 tablet  11  . insulin aspart (NOVOLOG) 100 UNIT/ML injection Inject into the skin 3 (three) times daily before meals. SSI      . insulin glargine (LANTUS) 100 UNIT/ML injection Inject 30 Units into the skin at bedtime.      Marland Kitchen levothyroxine (SYNTHROID, LEVOTHROID) 50 MCG tablet Take 1 tablet (50 mcg total) by mouth daily.  30 tablet  3  . lisinopril (ZESTRIL) 2.5 MG tablet Take 1 tablet (2.5 mg total) by mouth daily.  30 tablet  6  . loratadine (CLARITIN) 10 MG tablet Take 10 mg by mouth daily.      . metoprolol tartrate (LOPRESSOR) 25 MG tablet Take 25 mg by mouth 2 (two) times daily.      Marland Kitchen omeprazole (PRILOSEC) 20 MG capsule Take 20 mg by mouth daily.      . simvastatin (ZOCOR) 20 MG tablet Take 20 mg by mouth every evening.       The PMH, PSH, Social History, Family History, Medications, and allergies have been reviewed in [facility], and have been updated if relevant.  Review of Systems See HPI No CP No n/v/d Denies feeling depressed or anxious     Objective:   Physical Exam BP 144/60  Pulse 82  Resp 22  Wt 166 lb (75.297 kg) Gen:  Alert, pleasant female in NAD HEENT:  MMM, o2 per [place] Resp:  CTA bilaterally CVS:  Irregularly irregular Abd:  Soft, NT, pos bs Ext:  1+ edema bilaterally, no weeping       Assessment & Plan:   1. GERD (gastroesophageal reflux disease)  Stable on Prilosec.  2. Diabetes mellitus with neuropathy  CBGs stable, no changes.  3. Atrial fibrillation  Rate controlled. On metoprolol, ASA.  4. Cardiomyopathy She is declining.  On continous O2 now.  5. Recurrent epistaxis- Improving off coumadin but still having episodes.   Discussed this pt pt and her two daughters.  If this persists, will d/c ASA.    6.  Anemia- Likely due to  recurrent blood loss. Start iron.  Recheck CBC in 4 weeks. The patient indicates understanding of these issues and agrees with the plan.

## 2012-02-17 ENCOUNTER — Other Ambulatory Visit: Payer: Self-pay | Admitting: *Deleted

## 2012-02-17 MED ORDER — FUROSEMIDE 40 MG PO TABS: ORAL_TABLET | ORAL | Status: AC

## 2012-02-17 NOTE — Telephone Encounter (Signed)
Please call pt to verify how she is taking this. 

## 2012-02-17 NOTE — Telephone Encounter (Signed)
Spoke with patient, she says she does take as written in previous note.  Also, she says she's been taking iron for 2 weeks now and doesn't feel any better.  I told her that it may take more time to help her.

## 2012-02-17 NOTE — Telephone Encounter (Signed)
Faxed refill request from [facility] for lasix 40 mg's.  This isnt on med list.  Directions are for one and one half daily.  Please advise.

## 2012-02-17 NOTE — Telephone Encounter (Signed)
Added to med list, sent to [facility].

## 2012-02-17 NOTE — Telephone Encounter (Signed)
Ok to refill and yes it can take more time.

## 2012-03-10 ENCOUNTER — Ambulatory Visit: Admitting: Family Medicine

## 2012-03-17 ENCOUNTER — Ambulatory Visit: Payer: Medicare Other | Admitting: Family Medicine

## 2012-03-17 VITALS — BP 146/62 | HR 74 | Resp 20 | Wt 173.0 lb

## 2012-03-17 DIAGNOSIS — I4891 Unspecified atrial fibrillation: Secondary | ICD-10-CM

## 2012-03-17 DIAGNOSIS — I428 Other cardiomyopathies: Secondary | ICD-10-CM

## 2012-03-17 DIAGNOSIS — K219 Gastro-esophageal reflux disease without esophagitis: Secondary | ICD-10-CM

## 2012-03-17 DIAGNOSIS — R609 Edema, unspecified: Secondary | ICD-10-CM

## 2012-03-17 DIAGNOSIS — E1142 Type 2 diabetes mellitus with diabetic polyneuropathy: Secondary | ICD-10-CM

## 2012-03-17 DIAGNOSIS — R040 Epistaxis: Secondary | ICD-10-CM

## 2012-03-17 DIAGNOSIS — E1140 Type 2 diabetes mellitus with diabetic neuropathy, unspecified: Secondary | ICD-10-CM

## 2012-03-17 DIAGNOSIS — E1149 Type 2 diabetes mellitus with other diabetic neurological complication: Secondary | ICD-10-CM

## 2012-03-17 DIAGNOSIS — D500 Iron deficiency anemia secondary to blood loss (chronic): Secondary | ICD-10-CM

## 2012-03-17 NOTE — Progress Notes (Signed)
Subjective:    Patient ID: Alicia Henry, female    DOB: May 12, 1924, 79 y.o.   MRN: [id number]  HPI  79 yo female with h/o cardiomyopathy, afib, IDDM. She is now on hospice (remains full code) and requested a home visit. I am seeing her today with both of her daughter and Mickeal Skinner, hospice RN.  Says she feels "awful."  Has no energy.  Stopped taking coumadin several months ago due to recurrent nose bleeds.  Still taking ASA daily. Has not had a bad nose bleed in months.  Had to have spot on her tongue cauterized due to bleeding.    Hemoglobin has dropped again this month to 10.1 ( was 10.9 in October 2013).  MCV 97.  She is taking her oral iron but low dose (could not tolerate higher dose due to constipation and nausea).    Has been to hematologist in past.  She could not tolerate complete anemia workup- ie colonoscopy, so declined further work up.  Overall mood is ok but states she would feel better if she had more energy.  Very SOB with any amount of exertion, even walking to bathroom which is a few feet away.   CBGs running between 120-200.  Denies any episodes of hypoglycemia.   On Lantus and SSI. Too fatigued to make it to her endocrinology appointment. Patient Active Problem List  Diagnosis  . Valvular cardiomyopathy  . Atrial fibrillation  . Diabetes mellitus with neuropathy  . GERD (gastroesophageal reflux disease)  . Dependent edema  . Epistaxis, recurrent  . Normocytic anemia due to blood loss   Past Medical History  Diagnosis Date  . Valvular cardiomyopathy   . Atrial fibrillation   . Diabetes mellitus with neuropathy   . GERD (gastroesophageal reflux disease)    Past Surgical History  Procedure Date  . Abdominal hysterectomy   . Tonsillectomy   . Cataract extraction    History  Substance Use Topics  . Smoking status: Never Smoker   . Smokeless tobacco: Not on file  . Alcohol Use: Not on file   No family history on file. Allergies  Allergen Reactions   . Sulfa Antibiotics     hospitalized  . Penicillins Rash   Current Outpatient Prescriptions on File Prior to Visit  Medication Sig Dispense Refill  . aspirin 81 MG chewable tablet Chew 81 mg by mouth daily.      . budesonide-formoterol (SYMBICORT) 160-4.5 MCG/ACT inhaler Inhale 2 puffs into the lungs 2 (two) times daily.      . ferrous sulfate 300 (60 FE) MG/5ML syrup Take 5 mLs (300 mg total) by mouth daily.  150 mL  3  . fexofenadine (ALLEGRA) 180 MG tablet Take 1 tablet (180 mg total) by mouth daily.  30 tablet  11  . furosemide (LASIX) 40 MG tablet Take one and one half tablets by mouth daily.  45 tablet  0  . insulin aspart (NOVOLOG) 100 UNIT/ML injection Inject into the skin 3 (three) times daily before meals. SSI      . insulin glargine (LANTUS) 100 UNIT/ML injection Inject 30 Units into the skin at bedtime.      Marland Kitchen levothyroxine (SYNTHROID, LEVOTHROID) 50 MCG tablet Take 1 tablet (50 mcg total) by mouth daily.  30 tablet  3  . lisinopril (ZESTRIL) 2.5 MG tablet Take 1 tablet (2.5 mg total) by mouth daily.  30 tablet  6  . loratadine (CLARITIN) 10 MG tablet Take 10 mg by mouth daily.      Marland Kitchen  metoprolol tartrate (LOPRESSOR) 25 MG tablet Take 25 mg by mouth 2 (two) times daily.      Marland Kitchen omeprazole (PRILOSEC) 20 MG capsule Take 20 mg by mouth daily.      . simvastatin (ZOCOR) 20 MG tablet Take 20 mg by mouth every evening.       The PMH, PSH, Social History, Family History, Medications, and allergies have been reviewed in [facility], and have been updated if relevant.   Review of Systems See HPI No CP No n/v/d Denies feeling depressed or anxious     Objective:   Physical Exam BP 146/62  Pulse 74  Resp 20  Wt 173 lb (78.472 kg)  SpO2 90% Wt Readings from Last 3 Encounters:  03/17/12 173 lb (78.472 kg)  01/28/12 166 lb (75.297 kg)  12/01/11 177 lb (80.287 kg)    Gen:  Alert, pleasant female in NAD HEENT:  MMM, o2 per [place] Resp:  CTA bilaterally CVS:  Irregularly irregular Abd:   Soft, NT, pos bs Ext:  1+ edema bilaterally, no weeping       Assessment & Plan:   1. GERD (gastroesophageal reflux disease)  Stable on Prilosec.  2. Diabetes mellitus with neuropathy  CBGs stable, no changes.  3. Atrial fibrillation  Rate controlled. On metoprolol, ASA.  4. Cardiomyopathy She is declining.  On continous O2 now.  5. Iron deficiency anemia- continues to deteriorate.  D/c oral iron.  Will refer to hematology for palliative IV iron.      Discussed this pt pt and her two daughters.

## 2012-03-18 ENCOUNTER — Encounter: Payer: Self-pay | Admitting: Family Medicine

## 2012-03-29 ENCOUNTER — Ambulatory Visit: Payer: Self-pay | Admitting: Oncology

## 2012-03-30 ENCOUNTER — Telehealth: Payer: Self-pay

## 2012-03-30 NOTE — Telephone Encounter (Signed)
I cannot order IV iron unfortunately.  Shirlee Limerick, would there be any way to move this up? Thanks!

## 2012-03-30 NOTE — Telephone Encounter (Signed)
Called and asked Dr Orlie Dakin to please see our patient sooner and they are going to see her this week, on Thursday, February 20th at 11:30am. Clide Cliff to give her the new appt.

## 2012-03-30 NOTE — Telephone Encounter (Signed)
Darl Pikes pts daughter left v/m; pt scheduled to see Dr Orlie Dakin on 04/08/12 at 3:30 pm but person that called with this information could not confirm pt will get IV iron on 04/08/12. Darl Pikes said pt is very tired and weak. Darl Pikes does not know if pt can wait until next week to get IV iron. Darl Pikes request Dr Dayton Martes to try to get pt in sooner for IV iron. Darl Pikes also noted breathing treatments seem to be helping. Darl Pikes request call back.

## 2012-03-31 NOTE — Telephone Encounter (Signed)
Thank you :)

## 2012-04-06 ENCOUNTER — Encounter: Payer: Self-pay | Admitting: Family Medicine

## 2012-04-08 ENCOUNTER — Telehealth: Payer: Self-pay | Admitting: *Deleted

## 2012-04-08 DIAGNOSIS — I509 Heart failure, unspecified: Secondary | ICD-10-CM

## 2012-04-08 DIAGNOSIS — J449 Chronic obstructive pulmonary disease, unspecified: Secondary | ICD-10-CM

## 2012-04-08 DIAGNOSIS — I4891 Unspecified atrial fibrillation: Secondary | ICD-10-CM

## 2012-04-08 NOTE — Telephone Encounter (Signed)
Patient's daughter reports that there has not been any improvements with patient's iron infusion treatments.  She says it's been a week.  She's asking what do you think next step should be.  Please advise.

## 2012-04-08 NOTE — Telephone Encounter (Signed)
Unfortunately, I am not sure what else we can add.  Did the hematologist have any ideas?

## 2012-04-08 NOTE — Telephone Encounter (Signed)
Also, it typically takes more than a week to feel some improvement.

## 2012-04-09 NOTE — Telephone Encounter (Signed)
Advised patient's daughter.  She says she not talked with hematololgist.  I advised her to call that office to see what they suggest.  She said she would.

## 2012-04-09 NOTE — Telephone Encounter (Signed)
Left message asking daughter to call back

## 2012-04-10 ENCOUNTER — Ambulatory Visit: Payer: Self-pay | Admitting: Oncology

## 2012-06-08 DIAGNOSIS — J449 Chronic obstructive pulmonary disease, unspecified: Secondary | ICD-10-CM

## 2012-06-08 DIAGNOSIS — I4891 Unspecified atrial fibrillation: Secondary | ICD-10-CM

## 2012-06-08 DIAGNOSIS — I509 Heart failure, unspecified: Secondary | ICD-10-CM

## 2012-06-10 ENCOUNTER — Telehealth: Payer: Self-pay

## 2012-06-10 NOTE — Telephone Encounter (Signed)
Harlon Flor RN case mgr [facility] left v/m; pt has had marked decline in health over last 2 months; pt is diabetic and on insulin and pt received letter from endocrinologist that he will no longer prescribe insulin since pt has not seen doctor in office. Dr Dayton Martes does home visits for pt;Will Dr Dayton Martes take over prescribing insulin regimen. Does Dr Dayton Martes want labs done prior to taking over refilling insulin. [name] request call back.

## 2012-06-10 NOTE — Telephone Encounter (Signed)
Yes I can take over.  Please check a1c and CMET prior to refilling.

## 2012-06-10 NOTE — Telephone Encounter (Signed)
Alicia Henry, she will draw labs tomorrow.

## 2012-06-15 ENCOUNTER — Encounter: Payer: Self-pay | Admitting: Family Medicine

## 2012-06-16 ENCOUNTER — Emergency Department: Payer: Self-pay | Admitting: Internal Medicine

## 2012-06-16 LAB — CBC
HGB: 10.9 g/dL — ABNORMAL LOW (ref 12.0–16.0)
MCH: 32.3 pg (ref 26.0–34.0)
MCHC: 33.5 g/dL (ref 32.0–36.0)
MCV: 96 fL (ref 80–100)
Platelet: 207 10*3/uL (ref 150–440)
RBC: 3.37 10*6/uL — ABNORMAL LOW (ref 3.80–5.20)
RDW: 17.1 % — ABNORMAL HIGH (ref 11.5–14.5)
WBC: 5.7 10*3/uL (ref 3.6–11.0)

## 2012-06-16 LAB — COMPREHENSIVE METABOLIC PANEL
Albumin: 3.2 g/dL — ABNORMAL LOW (ref 3.4–5.0)
Alkaline Phosphatase: 118 U/L (ref 50–136)
BUN: 42 mg/dL — ABNORMAL HIGH (ref 7–18)
Calcium, Total: 9 mg/dL (ref 8.5–10.1)
Chloride: 104 mmol/L (ref 98–107)
Co2: 30 mmol/L (ref 21–32)
Creatinine: 1.85 mg/dL — ABNORMAL HIGH (ref 0.60–1.30)
EGFR (African American): 28 — ABNORMAL LOW
Glucose: 157 mg/dL — ABNORMAL HIGH (ref 65–99)
Osmolality: 289 (ref 275–301)
Potassium: 4.4 mmol/L (ref 3.5–5.1)
SGOT(AST): 16 U/L (ref 15–37)
SGPT (ALT): 15 U/L (ref 12–78)
Sodium: 138 mmol/L (ref 136–145)
Total Protein: 7.4 g/dL (ref 6.4–8.2)

## 2012-06-16 LAB — CK TOTAL AND CKMB (NOT AT ARMC)
CK, Total: 63 U/L (ref 21–215)
CK-MB: 2.1 ng/mL (ref 0.5–3.6)

## 2012-06-16 LAB — PRO B NATRIURETIC PEPTIDE: B-Type Natriuretic Peptide: 6086 pg/mL — ABNORMAL HIGH (ref 0–450)

## 2012-06-16 LAB — TROPONIN I: Troponin-I: 0.02 ng/mL

## 2012-06-18 ENCOUNTER — Telehealth: Payer: Self-pay | Admitting: *Deleted

## 2012-06-18 NOTE — Telephone Encounter (Signed)
I'm sorry that it was a bad experience but we cannot control wait time in ER.  Please update medlist to include Zaroxolyn.

## 2012-06-18 NOTE — Telephone Encounter (Signed)
Hospice nurse called to report to you that pt went to [facility] on Wednesday and was there for 8 hours but didn't get admitted, which made her daughter livid.  They did give palliative diuresis and was going to send her home with a foley cath, but the daughter said she would not be able to get around with the foley, so it was removed.  Mickeal Skinner reports that patient is feeling better, but still has a lot of edema- legs and abd still feel very hard.  Nurse is giving patient 2.5 mg of zaroxalyn every other day.

## 2012-06-18 NOTE — Telephone Encounter (Signed)
Med list updated

## 2012-07-06 ENCOUNTER — Other Ambulatory Visit: Payer: Self-pay | Admitting: Family Medicine

## 2012-07-16 ENCOUNTER — Telehealth: Payer: Self-pay | Admitting: *Deleted

## 2012-07-16 NOTE — Telephone Encounter (Signed)
See note below- also, form for diabetic supplies from [place] is on your desk for signature.

## 2012-07-16 NOTE — Telephone Encounter (Signed)
Prior auths are needed for several of pt's scripts.  Forms are on your desk, please review answers.

## 2012-07-19 NOTE — Telephone Encounter (Signed)
Signed and in my box. 

## 2012-07-21 NOTE — Telephone Encounter (Signed)
Form for prior auth for symbicort is on your desk.  I am unsure of the answers for this.  Is this medicine related to her terminal illness?

## 2012-07-21 NOTE — Telephone Encounter (Signed)
Forms faxed back to [facility] yesterday.

## 2012-07-30 NOTE — Telephone Encounter (Signed)
All forms were faxed back to [facility] and all meds approved, except for lancets and test strips, which are covered under patient's medicare part B.  Patient's daughter has been advise of this.

## 2012-08-02 ENCOUNTER — Telehealth: Payer: Self-pay | Admitting: *Deleted

## 2012-08-02 MED ORDER — DEXAMETHASONE 0.5 MG PO TABS: 0.5000 mg | ORAL_TABLET | Freq: Every day | ORAL | Status: DC

## 2012-08-02 MED ORDER — POTASSIUM CHLORIDE ER 10 MEQ PO TBCR: 10.0000 meq | EXTENDED_RELEASE_TABLET | Freq: Every day | ORAL | Status: DC

## 2012-08-02 NOTE — Telephone Encounter (Signed)
OK to try Kdur 10 meq daily, BMET in 1 week. Ok to d/c symbicort.  We could try oral steroids.   Rx for both sent.

## 2012-08-02 NOTE — Telephone Encounter (Signed)
Hospice nurse states that patient is having a problem with her legs cramping and she's asking if script for potassium can be sent to [facility] in [place].  She's also asking if symbicort can be d/c'd, as pt has difficulty inhaling this.  Nurse thinks pt may benefit from daily dexamethasone instead.  Please advise.

## 2012-08-02 NOTE — Telephone Encounter (Signed)
[facility], she will draw the lab next week.

## 2012-08-12 DIAGNOSIS — I509 Heart failure, unspecified: Secondary | ICD-10-CM

## 2012-08-12 DIAGNOSIS — I4891 Unspecified atrial fibrillation: Secondary | ICD-10-CM

## 2012-08-12 DIAGNOSIS — J449 Chronic obstructive pulmonary disease, unspecified: Secondary | ICD-10-CM

## 2012-08-30 ENCOUNTER — Other Ambulatory Visit: Payer: Self-pay | Admitting: Family Medicine

## 2012-09-10 ENCOUNTER — Ambulatory Visit: Payer: Self-pay | Admitting: Internal Medicine

## 2012-09-11 ENCOUNTER — Inpatient Hospital Stay: Payer: Self-pay | Admitting: Vascular Surgery

## 2012-09-11 LAB — CBC
HCT: 34.1 % — ABNORMAL LOW (ref 35.0–47.0)
MCH: 32.8 pg (ref 26.0–34.0)
MCHC: 33.9 g/dL (ref 32.0–36.0)
MCV: 97 fL (ref 80–100)
Platelet: 200 10*3/uL (ref 150–440)
RBC: 3.53 10*6/uL — ABNORMAL LOW (ref 3.80–5.20)
WBC: 8 10*3/uL (ref 3.6–11.0)

## 2012-09-11 LAB — COMPREHENSIVE METABOLIC PANEL
Alkaline Phosphatase: 156 U/L — ABNORMAL HIGH (ref 50–136)
Anion Gap: 7 (ref 7–16)
BUN: 74 mg/dL — ABNORMAL HIGH (ref 7–18)
Bilirubin,Total: 0.7 mg/dL (ref 0.2–1.0)
Chloride: 104 mmol/L (ref 98–107)
EGFR (Non-African Amer.): 20 — ABNORMAL LOW
Osmolality: 298 (ref 275–301)
Sodium: 137 mmol/L (ref 136–145)

## 2012-09-11 LAB — URINALYSIS, COMPLETE
Bacteria: NONE SEEN
Bilirubin,UR: NEGATIVE
Blood: NEGATIVE
Glucose,UR: NEGATIVE mg/dL (ref 0–75)
Ketone: NEGATIVE
Leukocyte Esterase: NEGATIVE
Nitrite: NEGATIVE
Protein: NEGATIVE
Specific Gravity: 1.008 (ref 1.003–1.030)
Squamous Epithelial: <1

## 2012-09-11 LAB — APTT: Activated PTT: 28.8 secs (ref 23.6–35.9)

## 2012-09-11 LAB — CK TOTAL AND CKMB (NOT AT ARMC): CK-MB: 2.7 ng/mL (ref 0.5–3.6)

## 2012-09-11 LAB — TROPONIN I: Troponin-I: <0.02 ng/mL

## 2012-09-11 LAB — PROTIME-INR: INR: 1.1

## 2012-09-12 LAB — BASIC METABOLIC PANEL
Anion Gap: 6 — ABNORMAL LOW (ref 7–16)
BUN: 66 mg/dL — ABNORMAL HIGH (ref 7–18)
Creatinine: 1.93 mg/dL — ABNORMAL HIGH (ref 0.60–1.30)
EGFR (African American): 27 — ABNORMAL LOW
EGFR (Non-African Amer.): 23 — ABNORMAL LOW
Glucose: 205 mg/dL — ABNORMAL HIGH (ref 65–99)
Potassium: 5.5 mmol/L — ABNORMAL HIGH (ref 3.5–5.1)

## 2012-09-12 LAB — APTT
Activated PTT: 60.9 secs — ABNORMAL HIGH (ref 23.6–35.9)
Activated PTT: 77.3 secs — ABNORMAL HIGH (ref 23.6–35.9)

## 2012-09-12 LAB — POTASSIUM: Potassium: 5 mmol/L (ref 3.5–5.1)

## 2012-09-13 LAB — BASIC METABOLIC PANEL
Anion Gap: 8 (ref 7–16)
BUN: 59 mg/dL — ABNORMAL HIGH (ref 7–18)
Calcium, Total: 8.1 mg/dL — ABNORMAL LOW (ref 8.5–10.1)
Creatinine: 1.83 mg/dL — ABNORMAL HIGH (ref 0.60–1.30)
EGFR (African American): 29 — ABNORMAL LOW
EGFR (Non-African Amer.): 25 — ABNORMAL LOW
Osmolality: 308 (ref 275–301)
Potassium: 4.8 mmol/L (ref 3.5–5.1)
Sodium: 142 mmol/L (ref 136–145)

## 2012-09-13 LAB — APTT: Activated PTT: 83.8 secs — ABNORMAL HIGH (ref 23.6–35.9)

## 2012-09-13 LAB — PLATELET COUNT: Platelet: 176 10*3/uL (ref 150–440)

## 2012-09-13 LAB — HEMOGLOBIN: HGB: 10.1 g/dL — ABNORMAL LOW (ref 12.0–16.0)

## 2012-09-14 LAB — BASIC METABOLIC PANEL
Anion Gap: 4 — ABNORMAL LOW (ref 7–16)
BUN: 45 mg/dL — ABNORMAL HIGH (ref 7–18)
Calcium, Total: 8.5 mg/dL (ref 8.5–10.1)
Co2: 29 mmol/L (ref 21–32)
EGFR (African American): 36 — ABNORMAL LOW
Glucose: 168 mg/dL — ABNORMAL HIGH (ref 65–99)
Osmolality: 300 (ref 275–301)

## 2012-09-14 LAB — APTT: Activated PTT: 76.2 secs — ABNORMAL HIGH (ref 23.6–35.9)

## 2012-09-22 ENCOUNTER — Telehealth: Payer: Self-pay | Admitting: *Deleted

## 2012-09-22 MED ORDER — OXYCODONE-ACETAMINOPHEN 5-325 MG PO TABS: 1.0000 | ORAL_TABLET | ORAL | Status: AC | PRN

## 2012-09-22 NOTE — Telephone Encounter (Signed)
rx faxed to [facility], manually

## 2012-09-22 NOTE — Telephone Encounter (Signed)
Rx done Okay to fax and then route to Dr A for review

## 2012-09-22 NOTE — Telephone Encounter (Signed)
Pt needs refill on percocet 5/325, one to two every 4 hours prn pain.  Pt is under hospice care and not doing very well.  Nurse is asking that script be faxed to [facility] [place], with notation of pt being under hospice care.

## 2012-09-27 ENCOUNTER — Telehealth: Payer: Self-pay | Admitting: Family Medicine

## 2012-10-11 ENCOUNTER — Ambulatory Visit: Payer: Self-pay | Admitting: Internal Medicine

## 2012-10-11 NOTE — Telephone Encounter (Signed)
Confidential Office Message [address] [place], [place] [address] p. [phone] f. [phone] To: [facility] (After Hours Triage) Fax: [phone] From: [facility] Date/ Time: Oct 11, 2012 7:44 AM Taken By: Coralie Keens, CSR Caller: Magda Paganini Facility: not collected Patient: Alicia Henry, Alicia Henry DOB: 1924-07-12 Phone: [phone] Reason for Call: Magda Paganini with [facility] calling to notify Dr Dayton Martes of pts death. Pt expired on 10/11/2012 at 0600. Regarding Appointment: Appt Date: Appt Time: Unknown Provider: Reason: Details: Outcome: Confidential

## 2012-10-11 NOTE — Telephone Encounter (Signed)
Noted.  I will call family to offer condolences.

## 2012-10-11 DEATH — deceased

## 2014-06-02 NOTE — Discharge Summary (Signed)
PATIENT NAME:  Alicia Henry, Alicia Henry MR#:  [id number] DATE OF BIRTH:  17-Apr-1924  DATE OF ADMISSION:  09/11/2012 DATE OF DISCHARGE:  09/14/2012  DISCHARGE DIAGNOSES:  1.  Ischemia of right lower extremity.  2.  Renal insufficiency.  3.  Congestive heart failure with ejection fraction less than 20.  4.  Chronic obstructive pulmonary disease, advanced, with home oxygen 3 liters nasal cannula.   PROCEDURE PERFORMED: None.   CONSULTATIONS: [facility] doctor, medical management 09/11/2012. Nephrology management of renal insufficiency 08/03.  Palliative care 08/04.   HISTORY: Ms. Alicia Henry is an [age] year old woman with multiple medical problems who was already in Hospice care and presented to the Emergency Room on 08/02 with increasing pain over her right lower extremity. In discussing with the patient, the pain had been ongoing for approximately 3 days and had been steadily worsening.   On the day of admission, evaluation in the ER demonstrated the right leg was pale with no capillary refill. She was insensate to the level of the knee and had very poor motor function. It was elected to initiate a heparin drip, but given her other comorbidities she had been evaluated 2 years ago by Dr. Darrold JunkerParaschos and was found to not be a candidate for anesthesia. Options for treatment were very limited.  Given her advanced renal disease, advanced cardiac disease and advanced COPD, it was elected to admit, hydrate, anticoagulate with heparin and continue with pain control. Nephrology was asked to see the patient.  [facility] doctor was managing her comorbidities, and then ultimately on hospital day #2 she was seen by Palliative Care as well. A plan was put in place for the patient to be discharged back to home with increased services from Hospice. Given all the choices, it was generally agreed that any attempts at limb salvage would be doing more harm than good and likely result in the patient's imminent death.  The patient's pain control has  been very good under oral medications, and she is choosing to go home and to visit with family rather than to suffer vain attempts at limb salvage and expedite her passing.   On hospital day #4, she is discharged to home with Hospice care. Her leg actually was slightly better in the sense that it was now a bit warmer, and her renal function with IV hydration had improved some. She is stable condition at the time of discharge. Hospice will follow up for the patient's activities needs, dietary needs and manage her medications.  Medications on discharge are as noted in the discharge summary.  ____________________________ Renford DillsGregory G. Kiasia Chou, MD ggs:cb D: 09/14/2012 18:24:14 ET T: 09/14/2012 23:10:04 ET JOB#: [id number]  cc: Renford DillsGregory G. Leondro Coryell, MD, <Dictator> Renford DillsGREGORY G Jaklyn Alen MD ELECTRONICALLY SIGNED 09/21/2012 8:50

## 2014-06-02 NOTE — H&P (Signed)
History of Present Illness The patient is an 81 year old female with an extensive past medical history of chronic respiratory failure on home O2 at 3 liters, chronic obstructive pulmonary disease, congestive heart failure, hypothyroidism, diabetes with neuropathy, atrial fibrillation who presented to the ER today with increasing pain in the right foot and leg.  On exam in the ER she was found to have a pale foot with sensory loss to the knee and markedly decreased motor function.  I was called to evaluate for a threatened limb.  On further discussion with the daughter present it was determined that the leg had been hurting for about three days but today it became so bad that she sought attention.  She is very debilitated and does not ambulate much so there really is no claudication distance.  She denies palpatations and although she has a history of afib she has not been on Coumadin.  She has not been able to follow with Dr Saralyn Pilar because she really can't get out of the house and Hospice currently comes in once a day to help with bathing and meals.   Past History PAST MEDICAL HISTORY:  1. Chronic respiratory failure due to chronic obstructive pulmonary disease on home oxygen 3 liters. 2. Congestive heart failure. Patient's last echo is from January 2012 which showed normal ejection fraction, severe LA dilatation, severe RA dilatation, moderate to severe MR, moderate to severe TR. 3. Hypothyroidism. 4. Diabetes with neuropathy.  5. Atrial fibrillation. 6. Anemia. 7. Coronary artery disease.  8. Sleep apnea, wears CPAP at night.  PAST SURGICAL HISTORY:  1. Tonsillectomy. 2. Hysterectomy. 3. Cataracts.   Primary Physician Dr Marjory Lies   Past Med/Surgical Hx:  CHF:   acid reflux:   COPD:   Htn:   IDDM:   afib:   Anemia:   Cataract Extraction:   Hysterectomy - Partial:   ALLERGIES:  Penicillin: Unknown  Septra DS: Rash  Gabapentin: Unknown  HOME MEDICATIONS: Medication Instructions  Status  Lantus Solostar Pen 100 units/mL subcutaneous solution 28 units in the morning 18 unit(s) subcutaneous once a day (at bedtime) Active  ProAir HFA CFC free 90 mcg/inh inhalation aerosol 2 puff(s) inhaled 4 times a day, As Needed - for Wheezing, for Shortness of Breath  Active  DuoNeb 2.5 mg-0.5 mg/3 mL inhalation solution 3 milliliter(s) inhaled every 4 hours Active  furosemide 40 mg oral tablet 1.5 tabs (23m) orally once a day. Active  MiraLax - oral powder for reconstitution 15 milliliter(s) orally once a day (at bedtime), As Needed - for Constipation Active  Mucinex 1200 mg oral tablet, extended release 1 tab(s) orally once a day Active  Aspirin Enteric Coated 81 mg oral delayed release tablet 2 tabs (1622m orally once a day. Active  Vitamin C 500 mg oral tablet 1 tab(s) orally once a day Active  dexamethasone 1 tab (5 milligram(s) orally once a day Active  metoprolol tartrate 25 mg oral tablet 0.5 tab (12.50m31morally 2 times a day. Active  NovoLog FlexPen 100 units/mL subcutaneous solution 7 unit(s) subcutaneous once a day at breakfast, 7units sub-q at lunch, and 12 units subcutaneous before supper. Active  lisinopril 2.5 mg oral tablet 1 tab(s) orally once a day Active  levothyroxine 50 mcg (0.05 mg) oral tablet 1 tab(s) orally once a day Active  omeprazole 40 mg oral delayed release capsule 1 cap(s) orally once a day Active   Family and Social History:  Social History negative tobacco, negative ETOH, negative Illicit drugs   Review  of Systems:  Fever/Chills No   Cough No   Sputum No   Abdominal Pain No   Diarrhea No   Constipation No   Nausea/Vomiting No   SOB/DOE No   Chest Pain No   Telemetry Reviewed Afib   Physical Exam:  GEN well developed, critically ill appearing   HEENT hearing intact to voice, dry oral mucosa   NECK supple  trachea midline   RESP normal resp effort  no use of accessory muscles  O2 present   CARD regular rate  no carotid  bruits  no JVD   ABD denies tenderness  soft  obese   EXTR negative cyanosis/clubbing, negative edema, right leg cold and insensate to the knee no pulses minimal motor; left foot pink and war with 1 sec cap refill, left pedal pulses nonpalpable; femoral pulses nonpalpable bialterally   SKIN No rashes, positive ulcers, sacral decub   NEURO cranial nerves intact, follows commands, no sensory right leg decreased motor   PSYCH alert, good insight   Lab Results: Hepatic:  02-Aug-14 14:47   Bilirubin, Total 0.7  Alkaline Phosphatase  156  SGPT (ALT) 25  SGOT (AST) 24  Total Protein, Serum 8.1  Albumin, Serum  3.3  Routine Chem:  02-Aug-14 14:47   Glucose, Serum  131  BUN  74  Creatinine (comp)  2.21  Sodium, Serum 137  Potassium, Serum  5.8  Chloride, Serum 104  CO2, Serum 26  Calcium (Total), Serum 9.0  Osmolality (calc) 298  eGFR (African American)  23  eGFR (Non-African American)  20 (eGFR values <37m/min/1.73 m2 may be an indication of chronic kidney disease (CKD). Calculated eGFR is useful in patients with stable renal function. The eGFR calculation will not be reliable in acutely ill patients when serum creatinine is changing rapidly. It is not useful in  patients on dialysis. The eGFR calculation may not be applicable to patients at the low and high extremes of body sizes, pregnant women, and vegetarians.)  Anion Gap 7  Cardiac:  02-Aug-14 14:47   CK, Total 69  CPK-MB, Serum 2.7 (Result(s) reported on 11 Sep 2012 at 03:29PM.)  Troponin I < 0.02 (0.00-0.05 0.05 ng/mL or less: NEGATIVE  Repeat testing in 3-6 hrs  if clinically indicated. >0.05 ng/mL: POTENTIAL  MYOCARDIAL INJURY. Repeat  testing in 3-6 hrs if  clinically indicated. NOTE: An increase or decrease  of 30% or more on serial  testing suggests a  clinically important change)  Routine UA:  02-Aug-14 14:47   Color (UA) Straw  Clarity (UA) Clear  Glucose (UA) Negative  Bilirubin (UA) Negative   Ketones (UA) Negative  Specific Gravity (UA) 1.008  Blood (UA) Negative  pH (UA) 5.0  Protein (UA) Negative  Nitrite (UA) Negative  Leukocyte Esterase (UA) Negative (Result(s) reported on 11 Sep 2012 at 04:29PM.)  RBC (UA) 2 /HPF  WBC (UA) NONE SEEN  Bacteria (UA) NONE SEEN  Epithelial Cells (UA) <1 /HPF  Mucous (UA) PRESENT  Hyaline Cast (UA) 2 /LPF (Result(s) reported on 11 Sep 2012 at 04:29PM.)  Routine Coag:  02-Aug-14 14:47   Activated PTT (APTT) 28.8 (A HCT value >55% may artifactually increase the APTT. In one study, the increase was an average of 19%. Reference: "Effect on Routine and Special Coagulation Testing Values of Citrate Anticoagulant Adjustment in Patients with High HCT Values." American Journal of Clinical Pathology 2006;126:400-405.)  Prothrombin 13.9  INR 1.1 (INR reference interval applies to patients on anticoagulant therapy. A single INR therapeutic  range for coumarins is not optimal for all indications; however, the suggested range for most indications is 2.0 - 3.0. Exceptions to the INR Reference Range may include: Prosthetic heart valves, acute myocardial infarction, prevention of myocardial infarction, and combinations of aspirin and anticoagulant. The need for a higher or lower target INR must be assessed individually. Reference: The Pharmacology and Management of the Vitamin K  antagonists: the seventh ACCP Conference on Antithrombotic and Thrombolytic Therapy. PJSRP.5945 Sept:126 (3suppl): [id number]. A HCT value >55% may artifactually increase the PT.  In one study,  the increase was an average of 25%. Reference:  "Effect on Routine and Special Coagulation Testing Values of Citrate Anticoagulant Adjustment in Patients with High HCT Values." American Journal of Clinical Pathology 2006;126:400-405.)  Routine Hem:  02-Aug-14 14:47   WBC (CBC) 8.0  RBC (CBC)  3.53  Hemoglobin (CBC)  11.6  Hematocrit (CBC)  34.1  Platelet Count (CBC) 200  (Result(s) reported on 11 Sep 2012 at 03:19PM.)  MCV 97  MCH 32.8  MCHC 33.9  RDW  16.1    Assessment/Admission Diagnosis 1.  Ischemic right leg in a patient with multiple significant medical problems.  I do not believe that her leg is salvagable given her COPD CHF with an ef of <20% and the new renal failure.  Clearly any use of contrast would certainly cause renal failure and her K+ is already problematic.  Dr Saralyn Pilar has already stated in the past that she would not tolerate anesthesia.  So, any meaningful attempts at limb salvage would come and grave cost to other vital organ systems.  I have discussed this with Dr Marjory Lies by phone to update her on the current situation and she agrees.  The patient is already involved with Hospice and I feel that this level of involvement should increase.  I have recommended palliative care and do not feel any emergent  procedures are going to do any good- only harm to the patient.  At this time the patient and family agree.  She will be admitted and pain control will be paramount.  Heparin will be started and gentle hydration, a dose of kayaxelate will be given although I do not believe it will be possible to completely control her hyperkalemia. I will work with the Palliative care team and Hospice to determine her disposition. 2.  Acute renal failure 3. CHF 4. COPD 5.  Hyperkalemia   Plan level 3 admit   Electronic Signatures: Hortencia Pilar (MD)  (Signed 02-Aug-14 18:12)  Authored: CHIEF COMPLAINT and HISTORY, PAST MEDICAL/SURGIAL HISTORY, ALLERGIES, HOME MEDICATIONS, FAMILY AND SOCIAL HISTORY, REVIEW OF SYSTEMS, PHYSICAL EXAM, LABS, ASSESSMENT AND PLAN   Last Updated: 02-Aug-14 18:12 by Hortencia Pilar (MD)

## 2014-06-02 NOTE — Consult Note (Signed)
PATIENT NAME:  Alicia Henry, KOPEC MR#:  [id number] DATE OF BIRTH:  Jul 08, 1924  DATE OF CONSULTATION:  09/11/2012  CONSULTING PHYSICIAN:  Truett Mcfarlan J. Cherlynn Kaiser, MD  REFERRING PHYSICIAN: Dr. Levora Dredge  PRIMARY CARE PHYSICIAN:  At [facility]  CHIEF COMPLAINT:  Nausea and right lower extremity pain.   HISTORY OF PRESENT ILLNESS: This is an 81 year old female who presented to the Emergency Room today due to persistent nausea, weakness, and also having some pain and numbness in her right lower extremity. As per the daughter, the patient's symptoms have been progressively getting worse over the past couple of days. Therefore, she came to the ER for further evaluation. In the ER, patient was noted to have a fairly cold right lower extremity and nonpalpable pulses in the right lower extremity. The patient was thought to have acute right ischemic leg. A Vascular Surgery consult was obtained, and patient was admitted on the vascular surgery services. Hospitalist services were contacted for medical management. The patient denies any chest pain, any shortness of breath. Admits to some nausea, but no vomiting, no diarrhea. No abdominal pain. She complains of right lower extremity pain and numbness to the right lower extremity, but no other associated symptoms presently.   REVIEW OF SYSTEMS:   CONSTITUTIONAL:  documented fever. No weight gain or weight loss.  EYES:  No blurry or double vision.  EARS, NOSE, THROAT:  No tinnitus. No postnasal drip. No redness of oropharynx. RESPIRATORY: No cough, no wheezing, no hemoptysis. Positive chronic dyspnea from COPD.   CARDIOVASCULAR: No chest pain, no orthopnea, no palpitations, no syncope.  GASTROINTESTINAL: No nausea, no vomiting, no diarrhea, no abdominal pain, no melena, no hematochezia.  GENITOURINARY:  No dysuria or hematuria.  ENDOCRINE:  No polyuria or nocturia. No heat or cold intolerance.  HEMATOLOGIC:  No anemia, no bruising, no bleeding.  INTEGUMENT:  No  rashes. No lesions.  MUSCULOSKELETAL:  No arthritis. No swelling. No gout.  NEUROLOGIC:  No numbness. No tingling. No ataxia. No seizure-type activity.  PSYCHIATRIC:  No anxiety. No insomnia. No ADD.   PAST MEDICAL HISTORY:  Consistent with diabetes, hypertension, hyperlipidemia, COPD, oxygen dependent, history of chronic atrial fibrillation, history of cardiomyopathy, history of congestive heart failure.   ALLERGIES:  GABAPENTIN, PENICILLIN, SEPTRA.   SOCIAL HISTORY:  No smoking. No alcohol abuse. No illicit drug abuse. Lives at home by herself.   FAMILY HISTORY:  The patient's mother and father died from complications of heart disease.   CURRENT MEDICATIONS: Aspirin 81 mg 2 tabs daily, dexamethasone 5 mg daily, DuoNeb q. 4 hours as needed, Lasix 40 mg 1-1/2 tab daily, Lantus 28 units in the morning, 18 units in the evening, Synthroid 50 mcg daily, lisinopril 2.5 mg daily, metoprolol tartrate 12.5 mg b.i.d., MiraLAX daily as needed, Mucinex 1200 mg daily, NovoLog flex pen 7 units at breakfast, 7 units at lunch, and 12 units at bedtime, omeprazole 40 mg daily, albuterol inhaler 2 puffs q.i.d. as needed, and vitamin C 500 mg daily.   PHYSICAL EXAMINATION:  VITAL SIGNS:  Temperature 97.8, pulse 64, respirations 18, blood pressure 159/68, sats 93% on 3 liters nasal cannula. GENERAL: She is a pleasant-appearing female, lethargic, but in no apparent distress.  HEAD, EYES, EARS, NOSE, THROAT EXAM:  She is atraumatic, normocephalic. Her extraocular muscles are intact. Pupils equal and react to light. Sclerae are anicteric. No conjunctival injection. No pharyngeal erythema.  NECK: Supple. There is no jugular venous distention, no bruits, no lymphadenopathy, no thyromegaly.  HEART EXAMINATION:  Regular rate and rhythm. No murmurs. No rubs. No clicks.  LUNGS: Clear to auscultation bilaterally. No rales, no rhonchi, no wheezes. No dullness to percussion.  ABDOMEN: Soft, flat, nontender, nondistended. Has  good bowel sounds. No hepatosplenomegaly appreciated.  EXTREMITIES:  No evidence of any cyanosis or clubbing or peripheral edema. She has nonpalpable pulses on the lower extremities bilaterally, +2 radial pulses bilaterally. Her right lower extremity is very cool to touch.  NEUROLOGIC: She is alert, awake, oriented x 1. Moves all extremities spontaneously. Globally weak. Otherwise, difficult to do a full neurological exam.  SKIN:  Moist, warm, with no rashes appreciated. Her right lower extremity is cool to touch.  LYMPHATIC: There is no cervical or axillary lymphadenopathy.   LABORATORY EXAMINATION:  Showed a serum glucose of 131, BUN 74, creatinine 2.21, sodium 137, potassium 5.8, chloride 104, bicarb 26. LFTs are within normal limits. Troponin less than 0.02. White cell count 8, hemoglobin 11.6, hematocrit 34.1, platelet count 200.   ASSESSMENT AND PLAN: This is an 81 year old female with history of diabetes, hypertension, hypothyroidism, history of chronic kidney disease, chronic obstructive pulmonary disease, oxygen dependent, history of chronic atrial fibrillation, gastroesophageal reflux disease, presents to the hospital with nausea and right lower extremity pain and numbness, and noted to have acute right lower extremity ischemia.   1.  Acute right lower extremity ischemia. This is likely the cause of patient's pain in the right leg and numbness. The patient has nonpalpable pulses in the right lower extremity as compared to the left. The patient has been seen by vascular, started on a heparin drip, which we will continue. The patient is a high risk candidate for angiogram with stent or even a surgical option, given her multiple comorbidities and her chronic kidney disease. Further plan as per Vascular Surgery for now. Continue supportive care, stated with heparin drip for now.  2.  Acute on chronic renal failure. The patient's baseline creatinine is around 1.3 to 1.6. It is elevated to 2.2  presently. This is likely prerenal azotemia and dehydration. I will hydrate her with IV fluids, follow BUN and creatinine and urine output, renal dose meds, avoid nephrotoxins, hold her Lasix and ACE inhibitor for now. If her creatinine gets worse, would consider getting a Nephrology consult.  3.  Diabetes. The patient's p.o. intake has been poor. Therefore, hold her Lantus for now. Continue sliding scale insulin.  4.  Hypothyroidism. Continue Synthroid.  5.  Gastroesophageal reflux disease. Continue Prilosec.  6.  Hypertension. Presently hemodynamically stable. Continue metoprolol. Hold lisinopril, given her acute renal failure.  7.  History of chronic atrial fibrillation. The patient is currently rate controlled and is in normal sinus rhythm. Continue metoprolol.  8.  Chronic obstructive pulmonary disease. No evidence of acute COPD exacerbation. Continue DuoNeb as needed. 9.  Hyperkalemia. This is likely secondary to the acute on chronic kidney disease. I agree with giving the patient Kayexalate and repeating a potassium in the morning for now. The patient does not have any acute EKG changes related to hyperkalemia presently.   Thank you so much for the consultation. Will follow along with you.   TIME SPENT:  50 minutes.     ____________________________ Rolly PancakeVivek J. Cherlynn KaiserSainani, MD vjs:mr D: 09/11/2012 19:37:02 ET T: 09/11/2012 20:22:30 ET JOB#: [id number]  cc: Rolly PancakeVivek J. Cherlynn KaiserSainani, MD, <Dictator> [place] SirenVIVEK J Gerlean Cid MD ELECTRONICALLY SIGNED 09/13/2012 14:46

## 2014-06-02 NOTE — Consult Note (Signed)
Brief Consult Note: Diagnosis: 1. Severe PVD w/ Right Lower ext.  2. DM 3. HTN 4. hx of chronic a. fib 5. COPD 6. Hypothyroidism 7. CKD 8. hx of CHF.   Patient was seen by consultant.   Consult note dictated.   Orders entered.   Comments: 81 yo female w/ hx of DM, HTN, hypothyroidism, hx of CKD, COPD (O2 dependent), hx of chronic a. fib, GERD, came into hospital w/ nausea and pain in the RLE and noted to have right ischemic leg.   1. Acute Right Lower ext. ischemia - cont. Heparin gtt.  cont. care as per Vascular.  - ?? angiogram w/ stent (vs) surgical option. Either way pt. is a high risk candidate given multiple comorbidities.   2. Acute on Chronic Renal Failure - Baseline Cr. around 1.3 - 1.6.  - cont. gentle IV fluids and monitor.  HOld Lasix, ACE. Consider Nephro consult if Cr. gets worse.   3. DM - cont. SSI 4. Hypothyroidism - cont. synthroid.  5. GERD - cont.Prilosec 6. HTn - cont. Metoprolol 7. hx of chronic a. fib - currently in NSR.  - cont. metoprolol.   8. COPD - no acute exacerbation.  - cont. duonebs PRN.  9. Hyperkalemia - likely due to acute on chronic CKD - agree w/ giving kayexylate and follow K.   Thanks for the consult and will follow with you.  Job # [id number].  Electronic Signatures: [place] SirenSainani, Jenica Costilow J (MD)  (Signed 02-Aug-14 19:37)  Authored: Brief Consult Note   Last Updated: 02-Aug-14 19:37 by [place] SirenSainani, Troyce Gieske J (MD)

## 2014-06-04 NOTE — Discharge Summary (Signed)
PATIENT NAME:  Alicia Henry, DELAIR MR#:  [id number] DATE OF BIRTH:  08/12/1924  DATE OF ADMISSION:  06/09/2011 DATE OF DISCHARGE:  06/13/2011   HOSPITAL COURSE: Ms. Ullmer is an 81 year old female who is followed by Hospice but is a FULL CODE for chronic obstructive pulmonary disease O2 dependent, previous history of congestive heart failure with valvular cardiomyopathy, hypothyroidism, insulin-dependent diabetic with neuropathy, chronic atrial fibrillation who while going to the bathroom became lightheaded and fell and hit her head. She had been slowly declining with inability to walk for some time. She was brought to the Emergency Room where she was found to be clinically dehydrated, otherwise cardiovascularly and neurologically intact. She was hydrated. She had evidence of acute on chronic renal failure which responded to hydration. Physical therapy confirmed her poor balance and weakness and felt like she would benefit from physical therapy and rehab center was found at [facility] that would accommodate her. During her hospitalization, she had a precipitous rise in her potassium level that was not well explained unless it was a combination of dehydration, extra orange juice use for her hypoglycemic symptoms but this responded quickly to further hydration and Kayexalate. She remained cardiovascularly stable. There was ability to reduce her medications. She is alert and oriented. Vital signs are stable and she is going to be transferred to [facility] for further convalescence.   LABORATORY, DIAGNOSTIC, AND RADIOLOGICAL DATA: At the time of admission her sugar was 168. Her B-type Natriuretic hormone was 2978. BUN 47, creatinine 1.54, sodium 140, potassium 4.7, eGFR was 31. Her total protein was 7.6, albumin 3.2, alkaline phosphatase 137, SGOT 21, SGPT 14. CPK 66. MB 19. Troponin less than 0.2. Thyroid stimulating hormone was 8.91, mildly elevated. White count was 5800, hemoglobin was 11.3. She had an antibody  screen. She was type A+; antibody negative. INR was 1.7 on admission. She had a urine that had mixed colonies but follow-up urinalysis was clear though she had been started on some Rocephin during the hospitalization. Potassium did spike to 6.9 24 hours but on May 2nd this was down to 4.9 over two values over the next 24 hours. At discharge on May 3rd, her sugar was 143, BUN was 33, creatinine 1.19, sodium 142, potassium 4.9, chloride 105, bicarb 31, eGFR 42, calcium was 8.1. INR was up to 3.2. She did have a urine creatinine of 56.6 and the protein in the urine was 26. Urinalysis on repeat was unremarkable. Protein electrophoresis is still pending. A Vitamin D level was borderline low at 21 with normal range up to 30.  Studies included a CT scan of the head without contrast that showed some generalized atrophy, no acute process. PA chest was consistent with pulmonary vascular congestion in the face of dehydration was probably more hypoventilation. There was mild cardiomegaly. Did not appear to have acute process.   MRI of the brain no acute abnormality with some white matter changes.  Bilateral carotid ultrasounds no hemodynamic blockages with normal antegrade through the vertebral noted.  Ultrasound of the kidney no hydronephrosis, no acute changes. Right kidney measured 10.61 cm x 4.93 cm x 4.86 cm. The left kidney measured 12.42 cm x 4.99 cm x 5.36 cm.   VITAL SIGNS AT DISCHARGE: Temperature 98.2, pulse was 71 and regular. She had been taken off her beta-blocker. Respirations 20, blood pressure 155/68. She had some orthostatic blood pressure readings during the past 12 hours. These were all stable. 93% oximetry with 3 liters at rest. Last fasting fingerstick  was 143.   Laboratory studies on the last day showed sugar 143, BUN 33, creatinine 1.19, sodium 142, potassium 4.9, chloride 105, bicarb 21, eGFR 42, steadily improving. Calcium was 8.0. INR was up to 3.2.   MEDICATIONS AT DISCHARGE:   1. Sliding scale insulin before meals and at bedtime. 3 units for 201 to 250; 5 units for 251 to 300; 7 units for 301 to 350, and 10 units for 351 to 400.  2. Though she had been on Lasix 40 mg daily at admission, this was being held. This may need to be restarted at some point. 3. Continue her simvastatin 20 mg daily.  4. Prilosec 20 mg daily.  5. Metoprolol tartrate 25 mg daily had been held but was restarted on the day of discharge. Pulse was up in the 70's. This was to help control her atrial fibrillation rate.  6. Her lisinopril 20 mg had been discontinued but was restarted today at 2.5 mg. This may need to be increased to 20 mg at some point during her rehabilitation.  7. Continue aspirin 81 mg daily.  8. Continue Synthroid 0.025 mg daily. 9. Ascorbic acid 500 mg b.i.d.  10. Symbicort 160/4.5 2 puffs b.i.d.  11. Loratadine 10 mg daily.  12. Spiriva 1 capsule at night.  13. MiraLAX 17 grams daily. She uses this p.r.n. for constipation but has been using it on an ongoing daily basis. 14. Novolin 7 units subcutaneous before breakfast, 7 units before lunch, 12 units before supper.  15. Lantus insulin 30 units daily usually given in the morning; hold if her sugar is less than 140.  16. Lantus insulin 20 units at bedtime; hold if sugars are less than 140.  17. Cymbalta 30 mg daily for her peripheral neuropathy and depression.  18. Dulcolax suppositories as needed.  19. Milk of Magnesia as needed. 20. Lubriderm lotion to the legs 4 times a day for her dry skin.  21. Lisinopril 2.5 mg daily.  22. Warfarin back to the 3 mg daily. INR is pending in a few days.   DISCHARGE INSTRUCTIONS: She will need her sutures removed from the forehead in approximately a week from the time they were put on; Wednesday or Thursday of this coming week.   DISCHARGE DIAGNOSES:  1. Generalized weakness with falls secondary to dehydration. 2. Chronic atrial fibrillation. 3. Insulin-dependent diabetic with  peripheral neuropathy.  4. Hypertensive cardiovascular disease.  5. Valvular cardiomyopathy with a previous history, but not recent, with some congestive failure. 6. O2 dependent chronic obstructive pulmonary disease.   There is a concern of why she had such a spike in her potassium that was not well explained. There is some suspicion that she may have gotten some potassium from her roommate accidentally delivered. This cannot be confirmed. ____________________________ Dianah Field. Mable Fill, MD dcc:drc D: 06/13/2011 08:28:14 ET T: 06/13/2011 08:48:34 ET JOB#: [id number]  cc: Dianah Field. Mable Fill, MD, <Dictator> Allyne Gee, MD [facility] endocrinologist at [address] [place], [place] [address], Fax [phone] Dr. Elayne Snare, [facility] in [place] SIGNED 06/20/2011 9:59

## 2014-06-04 NOTE — Consult Note (Signed)
PATIENT NAME:  Alicia Henry, Alicia Henry MR#:  [id number] DATE OF BIRTH:  12/29/1924  DATE OF CONSULTATION:  06/12/2011  REFERRING PHYSICIAN:  Dr. Andrey Farmer  CONSULTING PHYSICIAN:  Angellynn Kimberlin Lilian Kapur, MD  REASON FOR CONSULTATION: Hyperkalemia, known chronic kidney disease stage III.   HISTORY OF PRESENT ILLNESS: The patient is a pleasant 81 year old Caucasian female with past medical history of chronic obstructive pulmonary disease, diabetes mellitus, atrial fibrillation, anemia, coronary artery disease, sleep apnea, chronic kidney disease stage III, hypertension, history of heart failure with normal ejection fraction this admission who presented to [facility] on 06/09/2011. She was admitted status post fall. She reports that she was feeling quite dizzy before her fall. She ended up sustaining left eye ecchymosis as well as a laceration overlying her left forehead. It appears she is a bit weak to be discharged to home and skilled facility is being sought at present. We are now consulted for evaluation and management of hyperkalemia. Serum potassium upon admission was found to be 4.7. Earlier this morning potassium was 5.6 and then it was rechecked and it came back high at 6.5. Patient reports that she did have some orange juice yesterday for low blood sugars. However, there does not appear to be significant or excessive potassium intake. The patient is noted to be on an ACE inhibitor in the form of lisinopril which is currently on hold. She is currently receiving Kayexalate 15 grams p.o. q.8 hours for treatment. She has also been started on IV fluid hydration.   PAST MEDICAL HISTORY:  1. Diabetes mellitus.  2. Hypertension.  3. Atrial fibrillation on chronic anticoagulation.  4. Anemia.  5. Chronic kidney disease stage III, baseline creatinine 1.3.  6. Coronary artery disease.  7. Sleep apnea on CPAP.   PAST SURGICAL HISTORY:  1. Cataract removal.  2. Hysterectomy.   3. Tonsillectomy.  ALLERGIES: Penicillin and Septra.   CURRENT INPATIENT MEDICATIONS:  1. Normal saline at 100 mL/h. 2. Tylenol 650 mg p.o. every four hours p.r.n.  3. Ascorbic acid 500 mg p.o. b.i.d.  4. Aspirin 81 mg daily.  5. Symbicort 2 puffs inhaled b.i.d.  6. Cymbalta 30 mg p.o. daily. 7. Insulin aspart 7 units at breakfast and lunch and 12 units at dinner.  8. Lantus insulin 30 units subcutaneous daily  9. Lantus insulin 20 units at bedtime.  10. Sliding scale insulin.  11. Synthroid 0.025 mg p.o. q.6:00 a.m.  12. Lisinopril 20 mg daily, which is currently on hold.  13. Claritin 10 mg p.o. daily.  14. Omeprazole 20 mg p.o. q.6:00 a.m.  15. Zofran 4 mg p.o. q.6 hours p.r.n.   16. MiraLax 17 grams p.o. daily p.r.n.  17. Zocor 20 mg p.o. at bedtime.  18. Spiriva 1 capsule inhaled daily.  19. Warfarin 4 mg p.o. q.5:00 p.m.  20. Magnesium hydroxide 30 mL p.o. b.i.d.  21. Kayexalate 15 grams p.o. q.8 hours.  SOCIAL HISTORY: The patient lives in [place]. She is widowed. She has two adult children. She used to work at [employer]. She denies tobacco, alcohol, or illicit drug use.   FAMILY HISTORY: Mother and father both died secondary to congestive heart failure.    REVIEW OF SYSTEMS: CONSTITUTIONAL: Patient denies fevers, chills, weight loss. EYES: Denies diplopia, blurry vision. Has history of cataracts. HEENT: Denies headaches, hearing loss. Denies epistaxis, sore throat. PULMONARY: Denies cough. Does have chronic shortness of breath and is on oxygen for this. CARDIOVASCULAR: Denies chest pain or palpitations. Has history of atrial  fibrillation and is on chronic anticoagulation. GASTROINTESTINAL: Denies nausea, vomiting, anorexia. GENITOURINARY: Denies frequency, urgency, dysuria. NEUROLOGIC: Had presyncopal event prior to admission, continues to have some dizziness. MUSCULOSKELETAL: Denies joint pain, swelling or redness. INTEGUMENTARY: Denies skin rashes or lesions. ENDOCRINE:  Denies polyuria, polydipsia, polyphagia. HEMATOLOGIC/LYMPHATIC: Reports easy bruisability as she is on Coumadin. Has history of anemia. PSYCHIATRIC: Patient denies anxiety or bipolar disorder. ALLERGY/IMMUNOLOGIC: Denies seasonal allergies or history of immunodeficiency.   PHYSICAL EXAMINATION:  VITAL SIGNS: Temperature 98.4, pulse 59, respirations 22, blood pressure 152/73, pulse ox 99%.   GENERAL: Well-developed, well-nourished Caucasian female who appears her stated age, currently no acute distress.   HEENT: There is an ecchymosis involving the left eye. There is also a laceration overlying the left forehead. Pupils are equal, round, reactive to light. No scleral icterus. Conjunctivae are pink. No epistaxis noted. Gross hearing intact. Oral mucosa moist.   NECK: Supple without JVD or lymphadenopathy.   LUNGS: Lungs demonstrate mild bilateral wheezing with normal respiratory effort.   CARDIOVASCULAR: S1, S2 slightly irregular. 2/6 systolic ejection murmur heard.   ABDOMEN: Soft, nontender, nondistended. Bowel sounds positive. No rebound. No guarding. No gross organomegaly appreciated.   EXTREMITIES: Trace edema noted. There is hyperemia of bilateral lower extremities.   NEUROLOGICAL: Patient is awake, alert and oriented to time, person, and place. Strength is 5/5 in both upper and lower extremities.   MUSCULOSKELETAL: No joint redness, swelling or tenderness appreciated.   SKIN: Warm and dry. Bilateral upper extremity ecchymosis noted.   PSYCHIATRIC: Patient with appropriate affect and appears to have good insight into her current illness.   LABORATORY, DIAGNOSTIC AND RADIOLOGICAL DATA: Sodium 136, potassium 5.6, chloride 101, CO2 31, BUN 43, creatinine 1.34, glucose 156, eGFR 36, calcium 8.3. LFTs: Total protein 6.5, albumin 2.9, total bilirubin 0.4, direct bilirubin 0.2, alkaline phosphatase 130, ALT 13. CBC shows WBC 5.9, hemoglobin 10.8, hematocrit 33, platelets 198. Urinalysis shows  urine protein 30 mg/dL, 3 RBCs per high-power field, 1 WBC per high-power field.  IMPRESSION: This is an 81 year old Caucasian female with past medical history of chronic obstructive pulmonary disease, hypothyroidism, diabetes mellitus, hypertension, atrial fibrillation, chronic kidney disease stage III baseline creatinine 1.3, coronary artery disease, sleep apnea, reported congestive heart failure with normal ejection fraction this admission who presented to [facility] status post fall and with presyncopal symptoms. 1. Hyperkalemia. Patient's serum potassium this morning was noted as being 5.6. It was repeated and came back higher at 6.5. Patient reports that she did have some orange juice yesterday for low blood sugars which could have potentially added to her potassium load. However, it is unclear as to whether this would be enough to cause this level of hyperkalemia. I agree with holding lisinopril at present. We will increase the dose of Kayexalate to 30 grams p.o. q.8 hours for the next day or two. Patient does not appear to have any significant acidosis that would lead to worsening hyperkalemia. I also agree with IV fluid hydration at this point in time. We will check serum potassium later today.  2. Chronic kidney disease, stage III. Baseline creatinine appears to be 1.3. It appears that her renal insufficiency is chronic indeed. She only had mild protein on urinalysis. We will proceed with SPEP, UPEP, ANA, and renal ultrasound for further work-up.  3. Anemia, not otherwise specified. Patient likely has underlying anemia of chronic kidney disease. Hemoglobin was 10.8 and MCV was 96. As above, we are checking SPEP, UPEP. We will also check  serum iron studies. She had iron studies checked in 03/2009 which were normal; however, she is due for repeat labs at present.   I would like to thank Dr. Andrey Farmer for this kind referral. Further plan as patient  progresses.  ____________________________ Tama High, MD mnl:cms D: 06/12/2011 11:37:50 ET T: 06/12/2011 11:53:38 ET JOB#: [id number]  cc: Tama High, MD, <Dictator>  Mariah Milling Tabatha Razzano MD ELECTRONICALLY SIGNED 06/16/2011 21:15

## 2014-06-04 NOTE — H&P (Signed)
PATIENT NAME:  Alicia Henry, Alicia Henry MR#:  [id number] DATE OF BIRTH:  01/26/1925  DATE OF ADMISSION:  06/09/2011  REFERRING PHYSICIAN: ER physician, Dr. Dolores Frame PRIMARY CARE PHYSICIAN: Dr. Candelaria Stagers  CARDIOLOGIST: Dr. Darrold Junker PULMONOLOGIST: Dr. Freda Munro ENDOCRINOLOGIST: Dr. Lucianne Muss in [place]   CHIEF COMPLAINT: Dizziness, possible syncope.   HISTORY OF PRESENT ILLNESS: Patient is an 81 year old female with past medical history of chronic respiratory failure on home O2 at 3 liters, chronic obstructive pulmonary disease, congestive heart failure, hypothyroidism, diabetes with neuropathy, atrial fibrillation who woke up around 2:00 a.m. to get up to go to the bathroom. She used the bathroom and subsequently felt dizzy. She normally ambulates with a walker but her walker was not close by. She tried to walk to her recliner by holding onto the wall but fell and hit her head on the recliner. Patient is not sure if she passed out. She reports that similar episode had happened about 1 to 2 years ago. She does not know the cause for it. She was doing well since then. As per the patient's daughter patient is on hospice, however, she is not a DO NOT RESUSCITATE. The dose of her Lantus was recently changed. She was given a prescription for Neurontin, however, patient was not able to take it except for a few doses because it made her legs feel worse. For the last 2 to 3 days she has been taking Cymbalta with some improvement.   PAST MEDICAL HISTORY:  1. Chronic respiratory failure due to chronic obstructive pulmonary disease on home oxygen 3 liters. 2. Congestive heart failure. Patient's last echo is from January 2012 which showed normal ejection fraction, severe LA dilatation, severe RA dilatation, moderate to severe MR, moderate to severe TR. 3. Hypothyroidism. 4. Diabetes with neuropathy.  5. Atrial fibrillation. 6. Anemia. 7. Coronary artery disease.  8. Sleep apnea, wears CPAP at night.  PAST SURGICAL  HISTORY:  1. Tonsillectomy. 2. Hysterectomy. 3. Cataracts.    ALLERGIES: Penicillin, Septra.   MEDICATIONS:  1. Simvastatin 20 mg daily.  2. Coumadin 3 mg daily.  3. Omeprazole 20 mg daily.  4. Lasix 40 mg daily.  5. Lopressor 25 mg daily.  6. Lisinopril 20 mg daily.  7. Synthroid 25 mcg daily.  8. Aspirin 81 mg daily.  9. Vitamin C. 10. Claritin. 11. Symbicort. 12. Spiriva. 13. MiraLax. 14. Lantus 30 units in a.m. and 20 units in p.m.  15. NovoLog 7 units with breakfast and lunch and 12 units with dinner. 16. Cymbalta, unknown dose.  SOCIAL HISTORY: There is no history of smoking, alcohol or drug abuse. Patient lives alone. She walks with a walker.   FAMILY HISTORY: Mother died at age of 36 and had a blood clot. Father died of unknown reasons.    REVIEW OF SYSTEMS: CONSTITUTIONAL: Patient denies any fever, fatigue. EYES: Denies any blurred or double vision. ENT: Denies any tinnitus, ear pain. RESPIRATORY: Denies any cough, fever. CARDIOVASCULAR: Denies any chest pain, palpitations. GASTROINTESTINAL: Denies any nausea, vomiting, diarrhea, abdominal pain. GENITOURINARY: Denies any dysuria, hematuria. ENDO: Denies any polyuria or nocturia. HEME/LYMPH: Denies any anemia, easy bruisability. INTEGUMENTARY: Has chronic lower extremity swelling with chronic venostasis changes. No other acne, rash. MUSCULOSKELETAL: Denies any swelling, gout. NEUROLOGICAL: Has diabetic neuropathy. Denies any weakness. PSYCH: Denies any anxiety, depression.   PHYSICAL EXAMINATION:  VITAL SIGNS: Temperature 97.6, heart rate 71, respiratory rate 20, blood pressure 146/63, pulse oximetry 88% on room air.   GENERAL: Patient is an elderly 81 year old Caucasian female  lying comfortably in bed.   HEAD: Patient has a small bruise/skin tear on her left forehead anteriorly otherwise head is atraumatic, normocephalic.   EYES: There is pallor. No icterus or cyanosis. Pupils equal, round, and reactive to light and  accommodation. Extraocular movements intact.   ENT: Wet mucous membranes. No oropharyngeal erythema, thrush.   NECK: Supple. No masses. No JVD. No thyromegaly, lymphadenopathy.   CHEST WALL: No tenderness to palpation. Not using accessory muscles of respiration. No intercostal muscle retractions.   LUNGS: Bilaterally clear to auscultation. No wheezing, rales, or rhonchi.  CARDIOVASCULAR: S1, S2 irregularly irregular. No murmur, rubs, or gallops.   ABDOMEN: Soft, nontender, nondistended. No guarding. No rigidity. No organomegaly. Normal bowel sounds.   SKIN: Patient has bilateral lower extremity chronic venostasis changes and ecchymosis on her left upper extremity.   PERIPHERIES: There is 1+ pedal edema. Chronic lower extremity venostasis changes. 1+ pedal pulses.   MUSCULOSKELETAL: No cyanosis, clubbing.   NEUROLOGICAL: Awake, alert, oriented x3. Nonfocal neurological exam. Cranial nerves grossly intact.   PSYCH: Normal mood and affect.   LABORATORY, DIAGNOSTIC, AND RADIOLOGICAL DATA: CT head showed no acute abnormalities. White count 5.8, hemoglobin 11.3, hematocrit 34.6, platelets 198, glucose 168, BUN 47, creatinine 1.54, alkaline phosphatase 137, rest of CMP is normal. Cardiac enzymes negative. BNP 2978. INR 1.7.   ASSESSMENT AND PLAN: 81 year old female with multiple medical problems presents with possible syncope and dizziness. 1. Possible syncope/dizziness. Patient reports that she got up and felt dizzy and subsequently hit her head and may have passed out. Will check serial cardiac enzymes. Monitor on telemetry. Obtain a 2-D echocardiogram, MRI of the brain and carotid ultrasound. Will also check orthostatics. 2. Possible urinary tract infection. Patient denies any dysuria, however, she has 1+ leukocyte esterase in her urine. Will obtain urine culture and start on empiric antibiotics. 3. Atrial fibrillation. Appears to be rate controlled. INR is not therapeutic. Will increase the  dose of her Coumadin and check a TSH.  4. Chronic obstructive pulmonary disease. Appears to be stable. Will continue Symbicort, Spiriva and start on p.r.n. nebulizer treatment.  5. Anemia. Appears to be chronic. Level is stable at present.  6. Diabetes. Will place on insulin sliding scale, ADA diet. Continue Lantus and NovoLog with meals.  7. Chronic kidney disease, stable at present. 8. Congestive heart failure. Patient has diastolic congestive heart failure, appears to be compensated at present. Will continue Lasix.  9. History of hyperlipidemia. Will continue statin.  10. CODE STATUS: Patient is a FULL CODE.   Reviewed old medical records, discussed with the patient and her daughter the plan of care and management.   TIME SPENT: 75 minutes.  ____________________________ Darrick MeigsSangeeta Eustolia Drennen, MD sp:cms D: 06/09/2011 06:41:12 ET T: 06/09/2011 09:28:21 ET  JOB#: [id number] cc: Darrick MeigsSangeeta Jak Haggar, MD, <Dictator> Jimmie Mollyon C. Candelaria Stagershaplin, MD Darrick MeigsSANGEETA Angelina Neece MD ELECTRONICALLY SIGNED 06/14/2011 6:19

## 2015-06-08 IMAGING — CT CT ABD-PELV W/O CM
1 of 2 series · 14 of 32 positions shown, 18 images · non-contrast
Comparison: none

REASON FOR EXAM: (1) Right renal mass: complicated renal cyst on
ultrasound; (2) Right renal mass
COMMENTS:

PROCEDURE:     CT  - CT ABDOMEN AND PELVIS W[DATE]  [DATE]
RESULT:
TECHNIQUE: Helical noncontrast 3 mm sections were obtained from the lung
bases through the pubic symphysis.

[Series 2: 3mm soft tissue · axial · 0.81mm/px · z∈[-593,-197]mm · 14 of 150 slices shown, 18 images]
[im 12/150  soft-tissue]
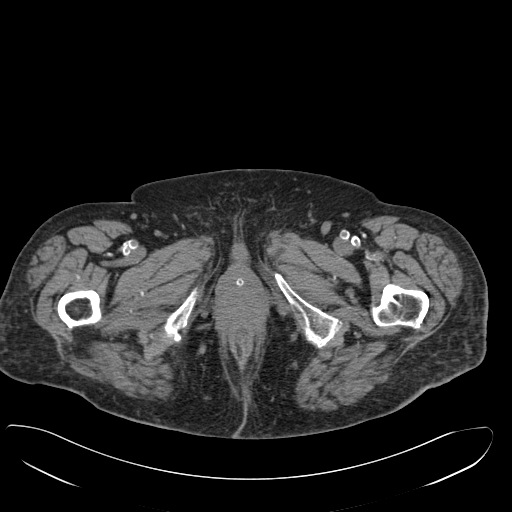
[im 12/150  bone]
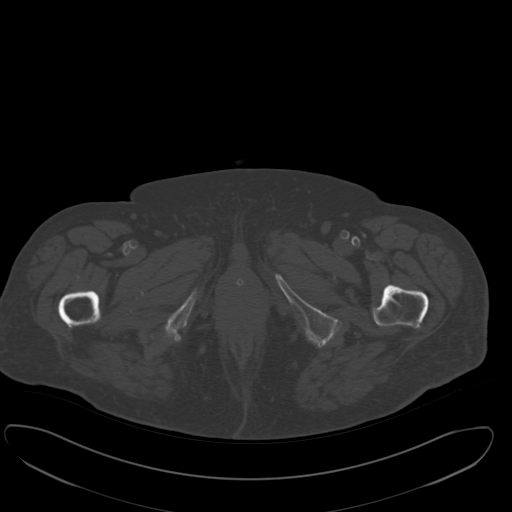
[im 24/150  soft-tissue]
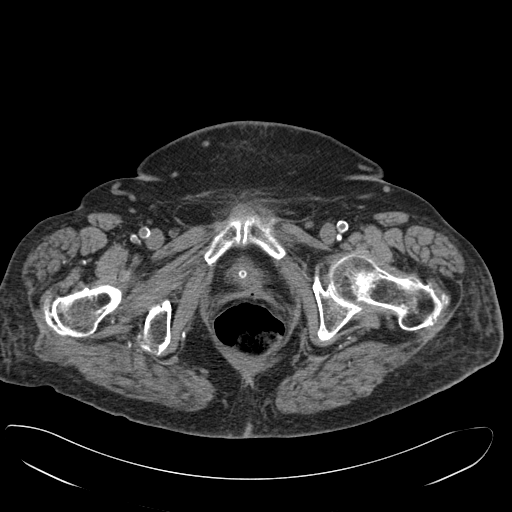
[im 36/150  soft-tissue]
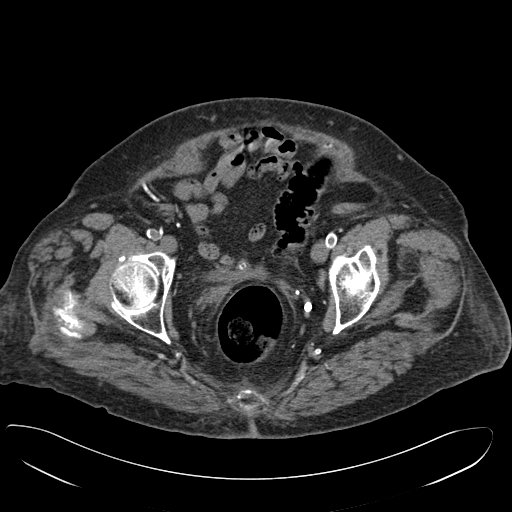
[im 48/150  soft-tissue]
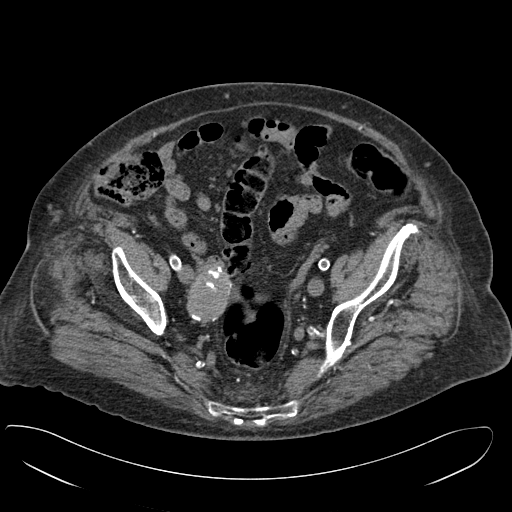
[im 60/150  soft-tissue]
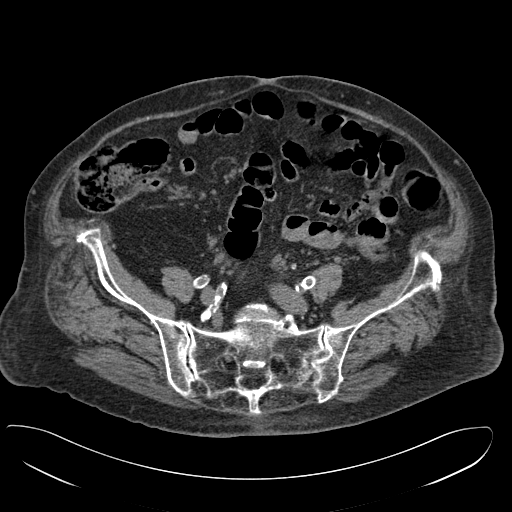
[im 72/150  soft-tissue]
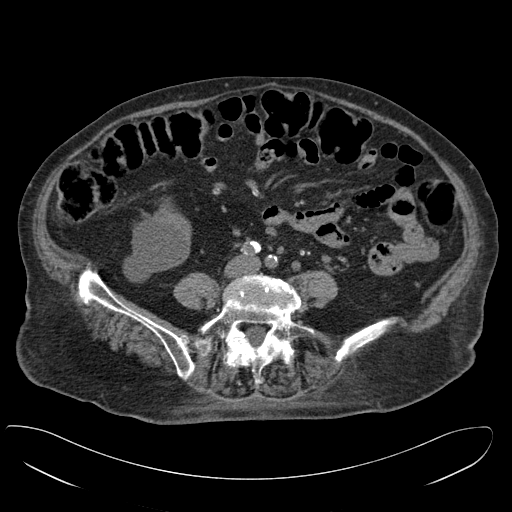
[im 84/150  soft-tissue]
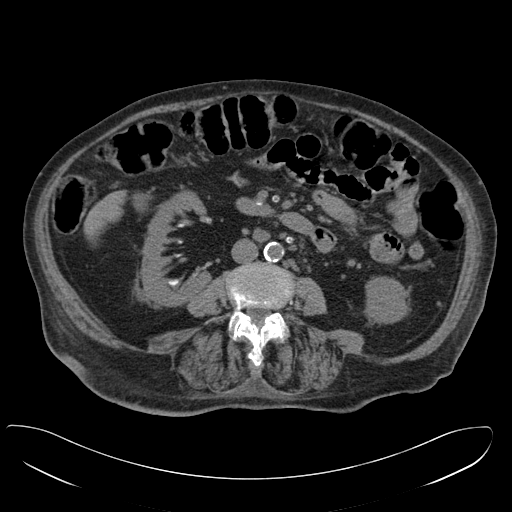
[im 96/150  soft-tissue]
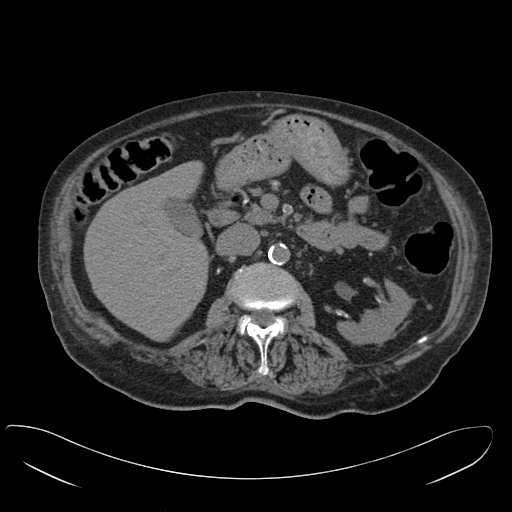
[im 108/150  soft-tissue]
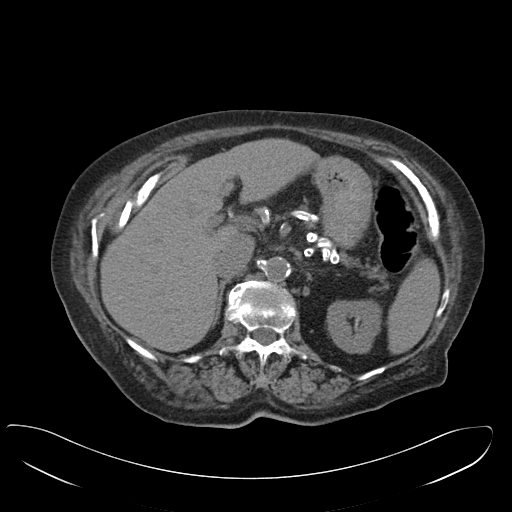
[im 108/150  bone]
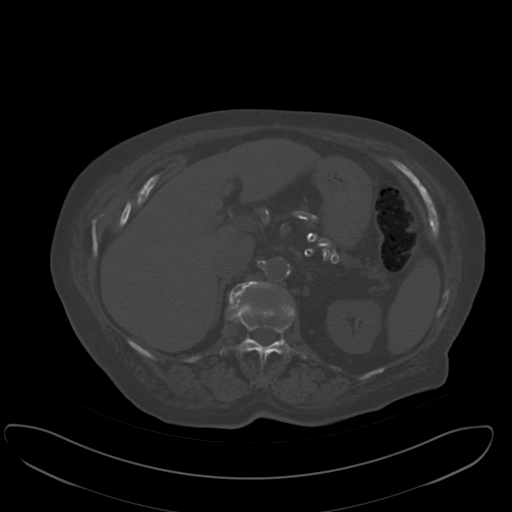
[im 120/150  soft-tissue]
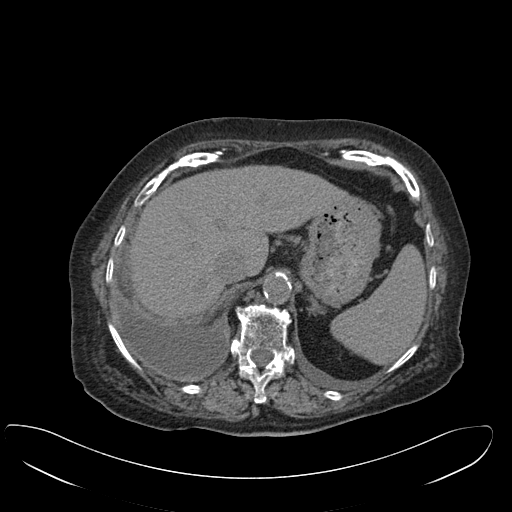
[im 126/150  lung]
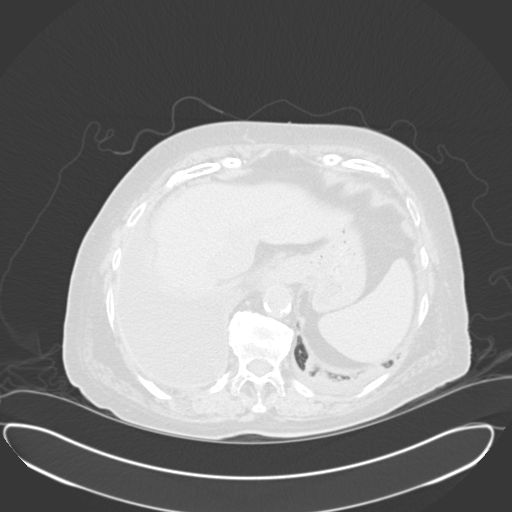
[im 132/150  soft-tissue]
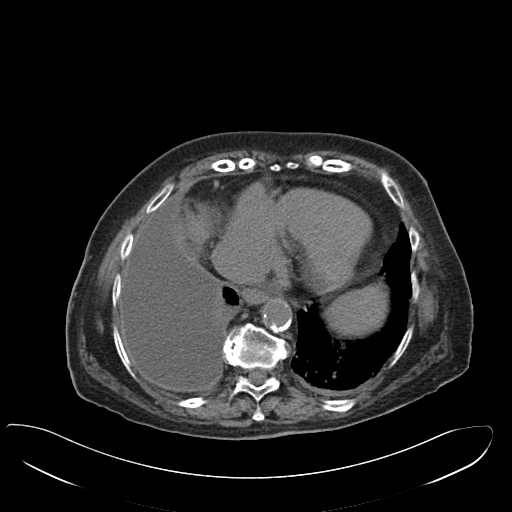
[im 132/150  lung]
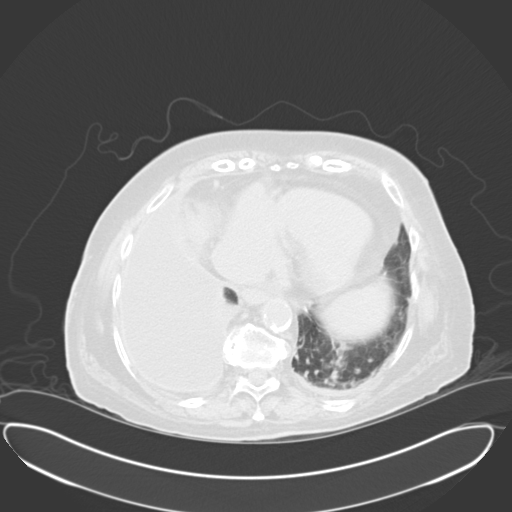
[im 138/150  lung]
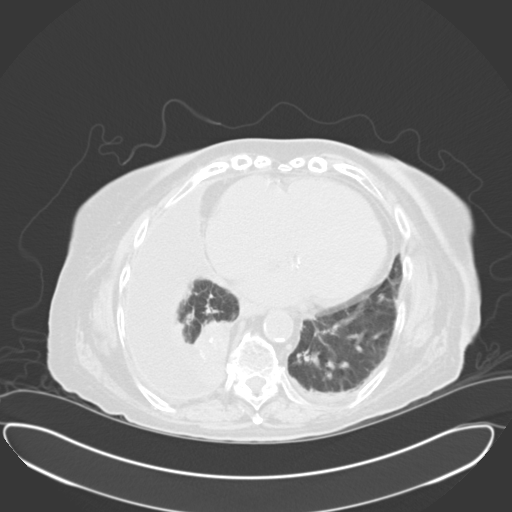
[im 144/150  soft-tissue]
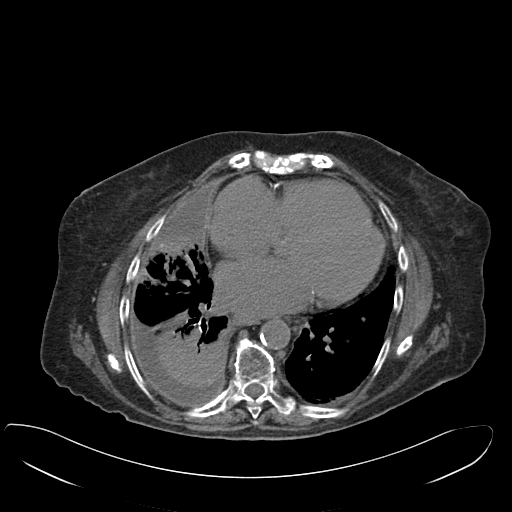
[im 144/150  lung]
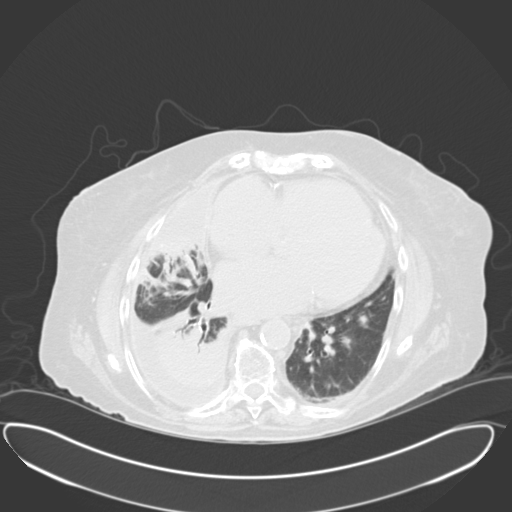

[14 of 32 positions shown; findings below may reference images not displayed]

FINDINGS: Multichamber cardiac enlargement is identified as well as coarse
coronary artery vascular calcifications. A right pleural effusion is
identified. Areas of consolidative density project within the periphery of
the right lung base. The aerated portions of the right lung demonstrate
thickening of the interstitial markings. Interstitial prominence is also
identified on the left as well as a trace effusion.

Noncontrast evaluation of the liver, spleen, adrenals, and pancreas are
unremarkable. A dependent calculus is appreciated within the gallbladder.

Evaluation of the right kidney demonstrates a low attenuating mass within
the lower pole measuring approximately 4.52 x 4.74 cm. A second, lobulated
component is identified measuring 2.2 cm in diameter. Within the apex of
this finding is a linear calcific focus. Small punctate medullary
calcifications project within the left kidney. Evaluation of the right
kidney demonstrates small, exophytic, low attenuating masses.

Within the posterior aspect of the pelvis, a [DATE] x 3.38 cm mass is
appreciated. This has the appearance of a soft tissue mass and punctate
calcifications are identified within the parenchyma. There is no evidence of
abdominal or pelvic free fluid or loculated fluid collections. Subcentimeter
mesenteric and retroperitoneal lymph nodes are identified. There is no
evidence of bowel obstruction, enteritis, colitis or diverticulitis.
IMPRESSION: 1.  Lobulated, indeterminate mass within the lower pole of the right kidney.
Hounsfield units of this area are 9. The noncontrast Hounsfield units are
nonspecific. When correlated with an ultrasound report from 06/12/2011, this
finding appears to correlate with a previously described complex cyst.
Surveillance evaluation with ultrasound could be obtained, if clinically
warranted, or further evaluation with triphasic CT versus contrasted MRI, if
clinically appropriate.
2.  Solid mass with punctate calcifications within the posterior aspect of
the right adnexal region. This may represent an enlarged, partially
calcified lymph node versus a non-nodal soft tissue mass, GYN versus non-GYN
origin. Clinical correlation is recommended and possibly GYN consultation.
3.  Right pleural effusion and trace, left pleural effusion. There are areas
of atelectasis versus infiltrate within the lung bases as well as findings
consistent with interstitial infiltrate, edematous versus nonedematous.
4.  Cardiomegaly and coarse coronary artery vascular calcifications.
5.  Coarse mesenteric artery calcifications are also identified as well as
iliac artery calcifications.
